# Patient Record
Sex: Female | Born: 1960
Health system: Southern US, Community
[De-identification: ages and names within clinical notes are randomized; demographics above are authoritative.]

## PROBLEM LIST (undated history)

## (undated) DIAGNOSIS — D219 Benign neoplasm of connective and other soft tissue, unspecified: Secondary | ICD-10-CM

## (undated) DIAGNOSIS — E669 Obesity, unspecified: Secondary | ICD-10-CM

## (undated) DIAGNOSIS — E039 Hypothyroidism, unspecified: Secondary | ICD-10-CM

## (undated) DIAGNOSIS — M1990 Unspecified osteoarthritis, unspecified site: Secondary | ICD-10-CM

## (undated) DIAGNOSIS — N939 Abnormal uterine and vaginal bleeding, unspecified: Secondary | ICD-10-CM

## (undated) DIAGNOSIS — I10 Essential (primary) hypertension: Secondary | ICD-10-CM

## (undated) HISTORY — DX: Hypothyroidism, unspecified: E03.9

## (undated) HISTORY — DX: Essential (primary) hypertension: I10

## (undated) HISTORY — PX: NO PAST SURGERIES: SHX2092

## (undated) HISTORY — DX: Obesity, unspecified: E66.9

---

## 2001-05-21 ENCOUNTER — Encounter: Payer: Self-pay | Admitting: Family Medicine

## 2001-05-21 ENCOUNTER — Ambulatory Visit (HOSPITAL_COMMUNITY): Admission: RE | Admit: 2001-05-21 | Discharge: 2001-05-21 | Payer: Self-pay | Admitting: Family Medicine

## 2003-02-05 ENCOUNTER — Encounter (INDEPENDENT_AMBULATORY_CARE_PROVIDER_SITE_OTHER): Payer: Self-pay | Admitting: Family Medicine

## 2003-02-05 ENCOUNTER — Encounter: Admission: RE | Admit: 2003-02-05 | Discharge: 2003-02-05 | Payer: Self-pay | Admitting: Family Medicine

## 2004-04-07 ENCOUNTER — Encounter (INDEPENDENT_AMBULATORY_CARE_PROVIDER_SITE_OTHER): Payer: Self-pay | Admitting: Family Medicine

## 2004-04-07 ENCOUNTER — Encounter: Admission: RE | Admit: 2004-04-07 | Discharge: 2004-04-07 | Payer: Self-pay | Admitting: Family Medicine

## 2004-12-08 ENCOUNTER — Ambulatory Visit: Payer: Self-pay | Admitting: Family Medicine

## 2004-12-22 ENCOUNTER — Encounter: Admission: RE | Admit: 2004-12-22 | Discharge: 2005-03-22 | Payer: Self-pay | Admitting: Family Medicine

## 2005-01-05 ENCOUNTER — Ambulatory Visit: Payer: Self-pay | Admitting: Family Medicine

## 2005-01-11 ENCOUNTER — Emergency Department (HOSPITAL_COMMUNITY): Admission: EM | Admit: 2005-01-11 | Discharge: 2005-01-11 | Payer: Self-pay | Admitting: Emergency Medicine

## 2005-04-20 ENCOUNTER — Ambulatory Visit: Payer: Self-pay | Admitting: Family Medicine

## 2005-05-11 ENCOUNTER — Ambulatory Visit: Payer: Self-pay | Admitting: Family Medicine

## 2005-10-06 ENCOUNTER — Ambulatory Visit: Payer: Self-pay | Admitting: Family Medicine

## 2005-10-10 ENCOUNTER — Ambulatory Visit: Payer: Self-pay | Admitting: Family Medicine

## 2005-12-07 ENCOUNTER — Ambulatory Visit: Payer: Self-pay | Admitting: Family Medicine

## 2006-01-13 ENCOUNTER — Encounter (INDEPENDENT_AMBULATORY_CARE_PROVIDER_SITE_OTHER): Payer: Self-pay | Admitting: *Deleted

## 2006-01-25 ENCOUNTER — Encounter (INDEPENDENT_AMBULATORY_CARE_PROVIDER_SITE_OTHER): Payer: Self-pay | Admitting: Family Medicine

## 2006-01-25 ENCOUNTER — Ambulatory Visit: Payer: Self-pay | Admitting: Family Medicine

## 2006-03-23 ENCOUNTER — Ambulatory Visit: Payer: Self-pay | Admitting: Family Medicine

## 2006-03-29 ENCOUNTER — Ambulatory Visit: Payer: Self-pay | Admitting: Family Medicine

## 2006-09-19 ENCOUNTER — Ambulatory Visit: Payer: Self-pay | Admitting: Family Medicine

## 2006-10-06 ENCOUNTER — Ambulatory Visit: Payer: Self-pay | Admitting: Family Medicine

## 2006-10-12 DIAGNOSIS — I10 Essential (primary) hypertension: Secondary | ICD-10-CM | POA: Insufficient documentation

## 2006-10-12 DIAGNOSIS — E669 Obesity, unspecified: Secondary | ICD-10-CM | POA: Insufficient documentation

## 2006-10-12 DIAGNOSIS — E785 Hyperlipidemia, unspecified: Secondary | ICD-10-CM | POA: Insufficient documentation

## 2006-10-12 DIAGNOSIS — M545 Low back pain, unspecified: Secondary | ICD-10-CM | POA: Insufficient documentation

## 2006-10-13 ENCOUNTER — Encounter (INDEPENDENT_AMBULATORY_CARE_PROVIDER_SITE_OTHER): Payer: Self-pay | Admitting: *Deleted

## 2006-10-20 ENCOUNTER — Encounter (INDEPENDENT_AMBULATORY_CARE_PROVIDER_SITE_OTHER): Payer: Self-pay | Admitting: Family Medicine

## 2007-01-02 ENCOUNTER — Ambulatory Visit: Payer: Self-pay | Admitting: Family Medicine

## 2007-01-16 ENCOUNTER — Telehealth: Payer: Self-pay | Admitting: *Deleted

## 2007-01-17 ENCOUNTER — Ambulatory Visit: Payer: Self-pay | Admitting: Family Medicine

## 2007-01-19 ENCOUNTER — Telehealth: Payer: Self-pay | Admitting: *Deleted

## 2007-01-22 ENCOUNTER — Ambulatory Visit: Payer: Self-pay | Admitting: Sports Medicine

## 2007-02-08 ENCOUNTER — Encounter (INDEPENDENT_AMBULATORY_CARE_PROVIDER_SITE_OTHER): Payer: Self-pay | Admitting: Family Medicine

## 2007-02-08 ENCOUNTER — Ambulatory Visit: Payer: Self-pay | Admitting: Family Medicine

## 2007-02-08 LAB — CONVERTED CEMR LAB
ALT: 17 units/L (ref 0–35)
AST: 20 units/L (ref 0–37)
Alkaline Phosphatase: 72 units/L (ref 39–117)
Cholesterol: 200 mg/dL (ref 0–200)
Creatinine, Ser: 0.91 mg/dL (ref 0.40–1.20)
HCT: 38.8 %
Hemoglobin: 13.6 g/dL
MCV: 83.2 fL
Platelets: 405 10*3/uL
TSH: 1.204 microintl units/mL (ref 0.350–5.50)
Total Bilirubin: 0.7 mg/dL (ref 0.3–1.2)
Total CHOL/HDL Ratio: 5
VLDL: 34 mg/dL (ref 0–40)

## 2007-03-06 ENCOUNTER — Telehealth (INDEPENDENT_AMBULATORY_CARE_PROVIDER_SITE_OTHER): Payer: Self-pay | Admitting: *Deleted

## 2007-03-07 ENCOUNTER — Ambulatory Visit: Payer: Self-pay | Admitting: Family Medicine

## 2007-03-07 LAB — CONVERTED CEMR LAB
Ketones, urine, test strip: NEGATIVE
Nitrite: NEGATIVE
Urobilinogen, UA: 0.2
pH: 6

## 2007-03-08 ENCOUNTER — Encounter (INDEPENDENT_AMBULATORY_CARE_PROVIDER_SITE_OTHER): Payer: Self-pay | Admitting: Family Medicine

## 2007-04-23 ENCOUNTER — Ambulatory Visit: Payer: Self-pay | Admitting: Family Medicine

## 2007-04-23 ENCOUNTER — Telehealth (INDEPENDENT_AMBULATORY_CARE_PROVIDER_SITE_OTHER): Payer: Self-pay | Admitting: *Deleted

## 2007-05-25 ENCOUNTER — Ambulatory Visit: Payer: Self-pay | Admitting: Family Medicine

## 2007-05-25 ENCOUNTER — Encounter (INDEPENDENT_AMBULATORY_CARE_PROVIDER_SITE_OTHER): Payer: Self-pay | Admitting: Family Medicine

## 2007-05-31 ENCOUNTER — Encounter: Admission: RE | Admit: 2007-05-31 | Discharge: 2007-08-29 | Payer: Self-pay | Admitting: Family Medicine

## 2007-06-01 ENCOUNTER — Encounter (INDEPENDENT_AMBULATORY_CARE_PROVIDER_SITE_OTHER): Payer: Self-pay | Admitting: *Deleted

## 2007-06-05 ENCOUNTER — Ambulatory Visit: Payer: Self-pay | Admitting: Family Medicine

## 2007-06-05 ENCOUNTER — Telehealth: Payer: Self-pay | Admitting: *Deleted

## 2007-06-14 ENCOUNTER — Telehealth (INDEPENDENT_AMBULATORY_CARE_PROVIDER_SITE_OTHER): Payer: Self-pay | Admitting: *Deleted

## 2007-06-19 ENCOUNTER — Telehealth (INDEPENDENT_AMBULATORY_CARE_PROVIDER_SITE_OTHER): Payer: Self-pay | Admitting: *Deleted

## 2007-06-20 ENCOUNTER — Encounter (INDEPENDENT_AMBULATORY_CARE_PROVIDER_SITE_OTHER): Payer: Self-pay | Admitting: Family Medicine

## 2007-06-25 ENCOUNTER — Encounter: Payer: Self-pay | Admitting: *Deleted

## 2007-07-26 ENCOUNTER — Encounter (INDEPENDENT_AMBULATORY_CARE_PROVIDER_SITE_OTHER): Payer: Self-pay | Admitting: Family Medicine

## 2007-07-27 ENCOUNTER — Ambulatory Visit: Payer: Self-pay | Admitting: Family Medicine

## 2007-07-27 ENCOUNTER — Telehealth: Payer: Self-pay | Admitting: *Deleted

## 2007-07-27 ENCOUNTER — Encounter (INDEPENDENT_AMBULATORY_CARE_PROVIDER_SITE_OTHER): Payer: Self-pay | Admitting: Family Medicine

## 2007-07-30 LAB — CONVERTED CEMR LAB
HCT: 38.5 % (ref 36.0–46.0)
Hemoglobin: 12.5 g/dL (ref 12.0–15.0)
RDW: 15.8 % — ABNORMAL HIGH (ref 11.5–15.5)

## 2007-07-31 ENCOUNTER — Encounter (INDEPENDENT_AMBULATORY_CARE_PROVIDER_SITE_OTHER): Payer: Self-pay | Admitting: Family Medicine

## 2007-07-31 ENCOUNTER — Ambulatory Visit (HOSPITAL_COMMUNITY): Admission: RE | Admit: 2007-07-31 | Discharge: 2007-07-31 | Payer: Self-pay | Admitting: Family Medicine

## 2007-08-03 ENCOUNTER — Ambulatory Visit: Payer: Self-pay | Admitting: Family Medicine

## 2007-08-03 DIAGNOSIS — E039 Hypothyroidism, unspecified: Secondary | ICD-10-CM | POA: Insufficient documentation

## 2007-09-03 ENCOUNTER — Ambulatory Visit: Payer: Self-pay | Admitting: Sports Medicine

## 2007-09-03 ENCOUNTER — Encounter (INDEPENDENT_AMBULATORY_CARE_PROVIDER_SITE_OTHER): Payer: Self-pay | Admitting: Family Medicine

## 2007-09-03 LAB — CONVERTED CEMR LAB: Hgb A1c MFr Bld: 5.8 %

## 2007-09-04 ENCOUNTER — Encounter (INDEPENDENT_AMBULATORY_CARE_PROVIDER_SITE_OTHER): Payer: Self-pay | Admitting: Family Medicine

## 2007-09-04 LAB — CONVERTED CEMR LAB: TSH: 38.269 microintl units/mL — ABNORMAL HIGH (ref 0.350–5.50)

## 2007-11-01 ENCOUNTER — Ambulatory Visit: Payer: Self-pay | Admitting: Family Medicine

## 2007-11-01 ENCOUNTER — Encounter (INDEPENDENT_AMBULATORY_CARE_PROVIDER_SITE_OTHER): Payer: Self-pay | Admitting: Family Medicine

## 2007-11-02 ENCOUNTER — Encounter (INDEPENDENT_AMBULATORY_CARE_PROVIDER_SITE_OTHER): Payer: Self-pay | Admitting: Family Medicine

## 2007-11-07 ENCOUNTER — Ambulatory Visit: Payer: Self-pay | Admitting: Family Medicine

## 2007-11-07 ENCOUNTER — Encounter (INDEPENDENT_AMBULATORY_CARE_PROVIDER_SITE_OTHER): Payer: Self-pay | Admitting: Family Medicine

## 2007-11-07 LAB — CONVERTED CEMR LAB
Albumin: 4.6 g/dL (ref 3.5–5.2)
CO2: 27 meq/L (ref 19–32)
Chloride: 99 meq/L (ref 96–112)
Glucose, Bld: 95 mg/dL (ref 70–99)
H Pylori IgG: POSITIVE
HCT: 36.2 % (ref 36.0–46.0)
Lipase: 13 units/L (ref 0–75)
Lymphocytes Relative: 17 % (ref 12–46)
Lymphs Abs: 1.5 10*3/uL (ref 0.7–4.0)
Neutro Abs: 6.1 10*3/uL (ref 1.7–7.7)
Neutrophils Relative %: 70 % (ref 43–77)
Platelets: 519 10*3/uL — ABNORMAL HIGH (ref 150–400)
Potassium: 4.1 meq/L (ref 3.5–5.3)
Sodium: 140 meq/L (ref 135–145)
Total Protein: 7.9 g/dL (ref 6.0–8.3)
WBC: 8.7 10*3/uL (ref 4.0–10.5)

## 2007-11-08 ENCOUNTER — Telehealth (INDEPENDENT_AMBULATORY_CARE_PROVIDER_SITE_OTHER): Payer: Self-pay | Admitting: Family Medicine

## 2007-11-12 ENCOUNTER — Telehealth: Payer: Self-pay | Admitting: *Deleted

## 2007-11-13 ENCOUNTER — Encounter: Admission: RE | Admit: 2007-11-13 | Discharge: 2007-11-13 | Payer: Self-pay | Admitting: Family Medicine

## 2007-11-30 ENCOUNTER — Telehealth (INDEPENDENT_AMBULATORY_CARE_PROVIDER_SITE_OTHER): Payer: Self-pay | Admitting: Family Medicine

## 2007-12-17 ENCOUNTER — Ambulatory Visit: Payer: Self-pay | Admitting: Sports Medicine

## 2007-12-17 ENCOUNTER — Encounter: Payer: Self-pay | Admitting: *Deleted

## 2007-12-20 ENCOUNTER — Encounter (INDEPENDENT_AMBULATORY_CARE_PROVIDER_SITE_OTHER): Payer: Self-pay | Admitting: Family Medicine

## 2007-12-20 ENCOUNTER — Ambulatory Visit: Payer: Self-pay | Admitting: Family Medicine

## 2007-12-25 ENCOUNTER — Encounter (INDEPENDENT_AMBULATORY_CARE_PROVIDER_SITE_OTHER): Payer: Self-pay | Admitting: Family Medicine

## 2008-01-03 ENCOUNTER — Telehealth: Payer: Self-pay | Admitting: *Deleted

## 2008-01-04 ENCOUNTER — Ambulatory Visit: Payer: Self-pay | Admitting: Family Medicine

## 2008-01-04 ENCOUNTER — Encounter: Admission: RE | Admit: 2008-01-04 | Discharge: 2008-01-04 | Payer: Self-pay | Admitting: Family Medicine

## 2008-01-04 ENCOUNTER — Encounter (INDEPENDENT_AMBULATORY_CARE_PROVIDER_SITE_OTHER): Payer: Self-pay | Admitting: Family Medicine

## 2008-01-04 LAB — CONVERTED CEMR LAB
Basophils Absolute: 0.1 10*3/uL (ref 0.0–0.1)
Eosinophils Absolute: 0.5 10*3/uL (ref 0.0–0.7)
Lymphocytes Relative: 23 % (ref 12–46)
Lymphs Abs: 2 10*3/uL (ref 0.7–4.0)
MCV: 81.3 fL (ref 78.0–100.0)
Neutrophils Relative %: 64 % (ref 43–77)
Platelets: 430 10*3/uL — ABNORMAL HIGH (ref 150–400)
RDW: 16.2 % — ABNORMAL HIGH (ref 11.5–15.5)
WBC: 8.4 10*3/uL (ref 4.0–10.5)

## 2008-01-08 ENCOUNTER — Telehealth (INDEPENDENT_AMBULATORY_CARE_PROVIDER_SITE_OTHER): Payer: Self-pay | Admitting: Family Medicine

## 2008-01-10 ENCOUNTER — Encounter (INDEPENDENT_AMBULATORY_CARE_PROVIDER_SITE_OTHER): Payer: Self-pay | Admitting: Family Medicine

## 2008-10-06 ENCOUNTER — Ambulatory Visit: Payer: Self-pay | Admitting: Family Medicine

## 2008-10-06 ENCOUNTER — Encounter (INDEPENDENT_AMBULATORY_CARE_PROVIDER_SITE_OTHER): Payer: Self-pay | Admitting: Family Medicine

## 2008-10-07 LAB — CONVERTED CEMR LAB
Free T4: 0.22 ng/dL — ABNORMAL LOW (ref 0.89–1.80)
TSH: 39.022 microintl units/mL — ABNORMAL HIGH (ref 0.350–4.50)

## 2008-10-28 ENCOUNTER — Encounter (INDEPENDENT_AMBULATORY_CARE_PROVIDER_SITE_OTHER): Payer: Self-pay | Admitting: Family Medicine

## 2008-12-04 ENCOUNTER — Encounter (INDEPENDENT_AMBULATORY_CARE_PROVIDER_SITE_OTHER): Payer: Self-pay | Admitting: Family Medicine

## 2008-12-04 ENCOUNTER — Ambulatory Visit: Payer: Self-pay | Admitting: Family Medicine

## 2009-02-04 IMAGING — US US ABDOMEN COMPLETE
1 series · 14 of 25 positions shown · non-contrast
Comparison: None

CLINICAL DATA: Epigastric pain

ABDOMEN ULTRASOUND
TECHNIQUE: Complete abdominal ultrasound examination was performed
including evaluation of the liver, gallbladder, bile ducts,
pancreas, kidneys, spleen, IVC, and abdominal aorta.

[Series 1: us abdomen complete · 0.37mm/px · 14 of 56 slices shown]
[im 1/56]
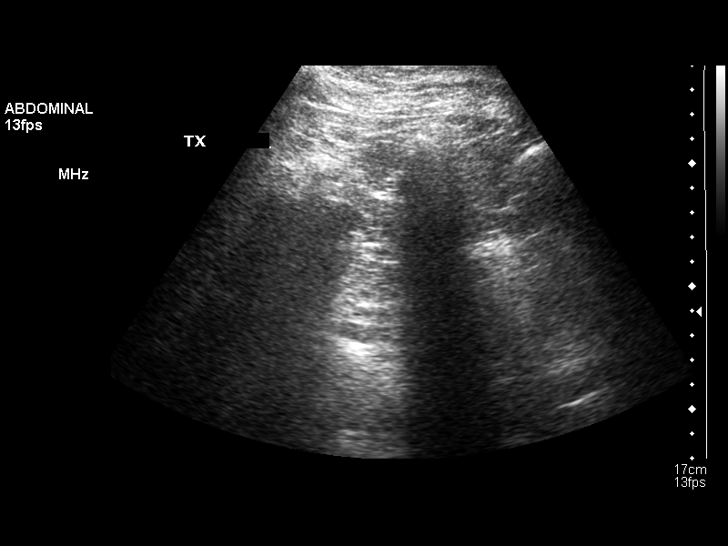
[im 5/56]
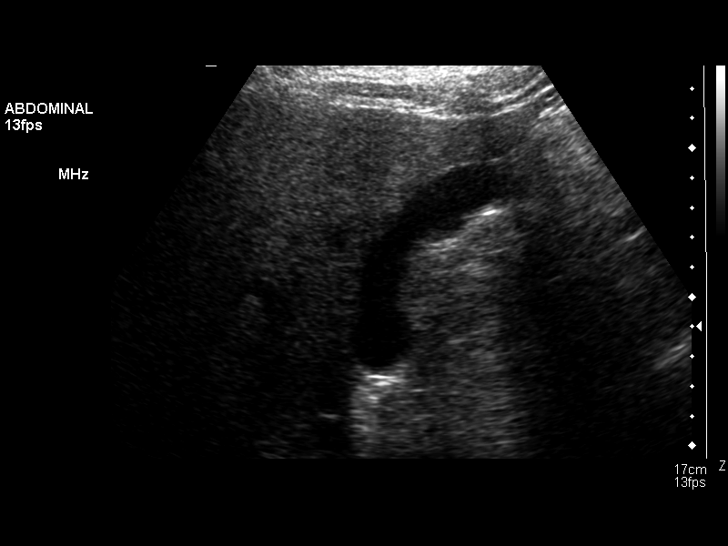
[im 10/56]
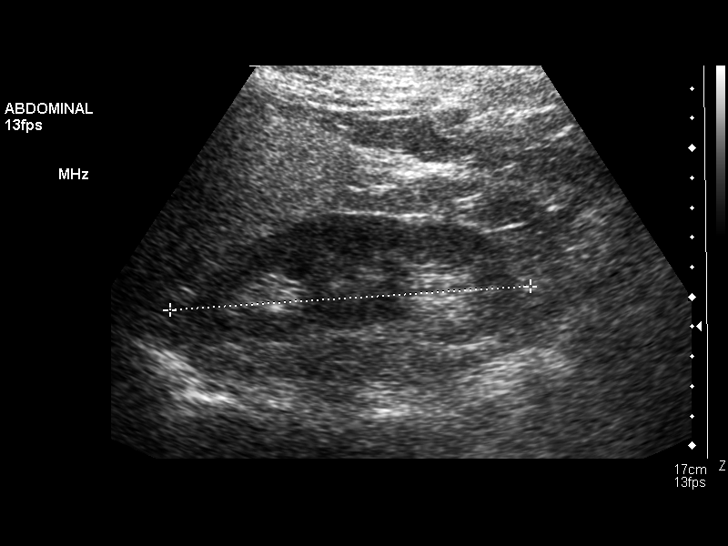
[im 14/56]
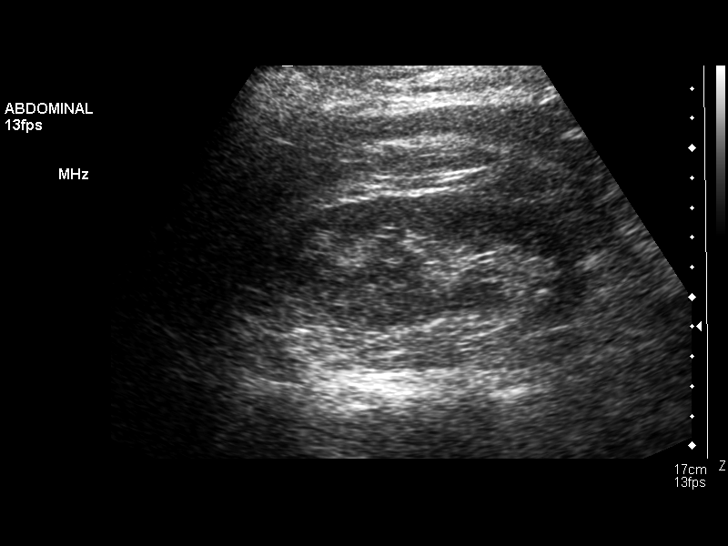
[im 19/56]
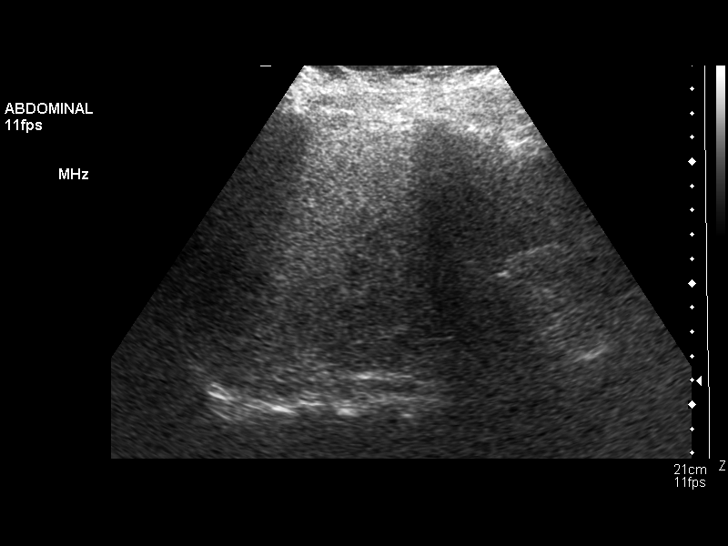
[im 21/56]
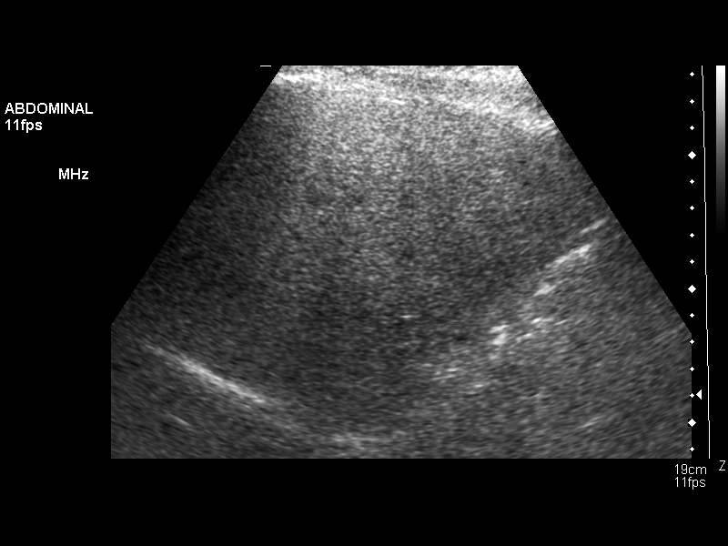
[im 26/56]
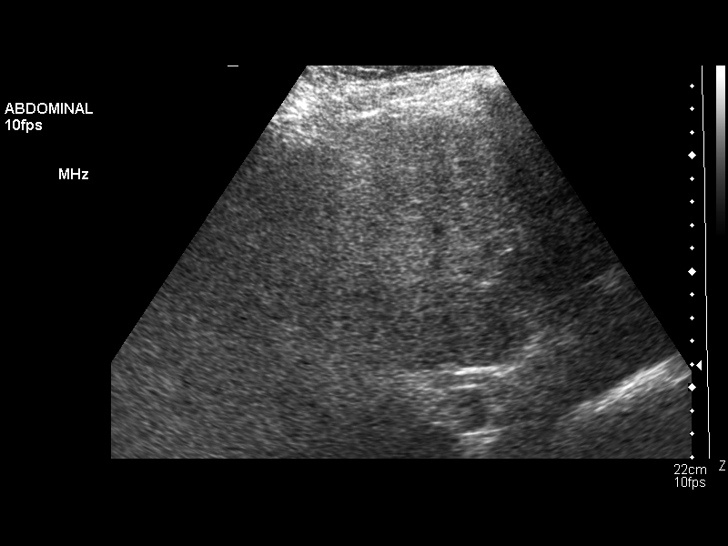
[im 30/56]
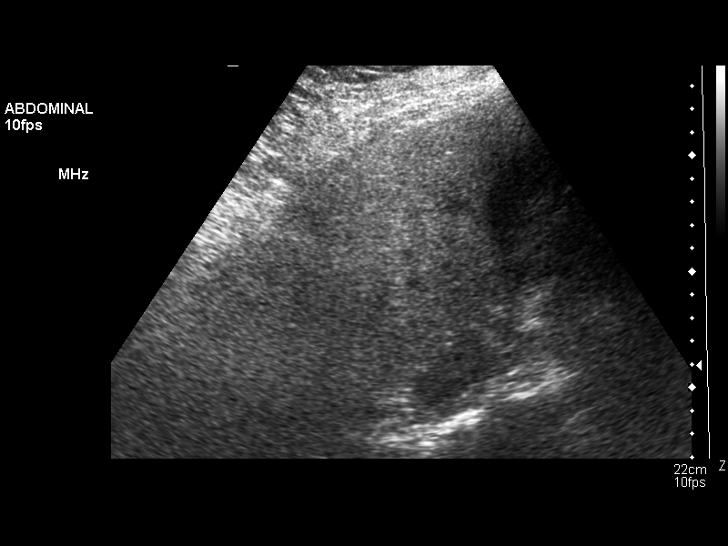
[im 35/56]
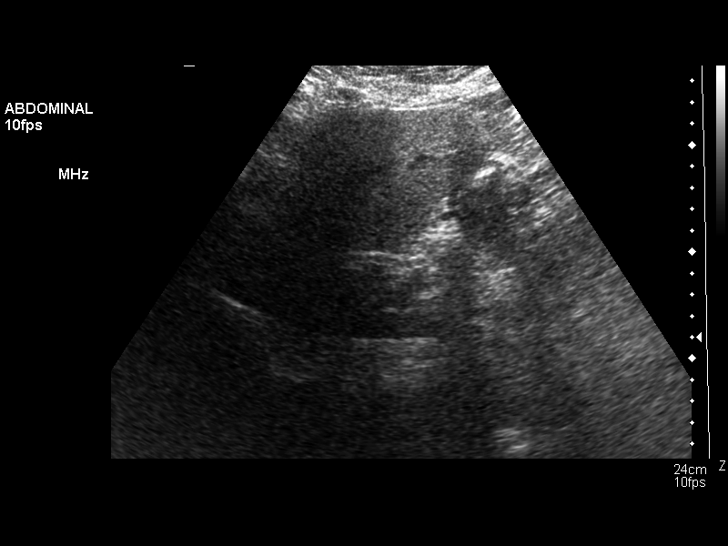
[im 37/56]
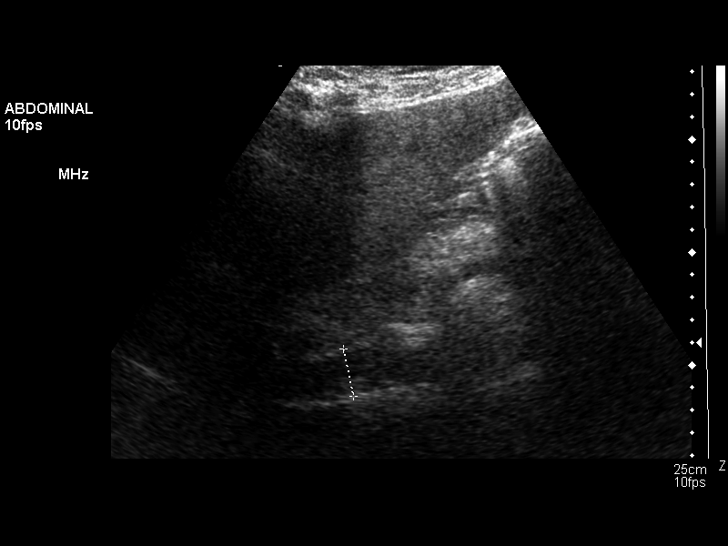
[im 42/56]
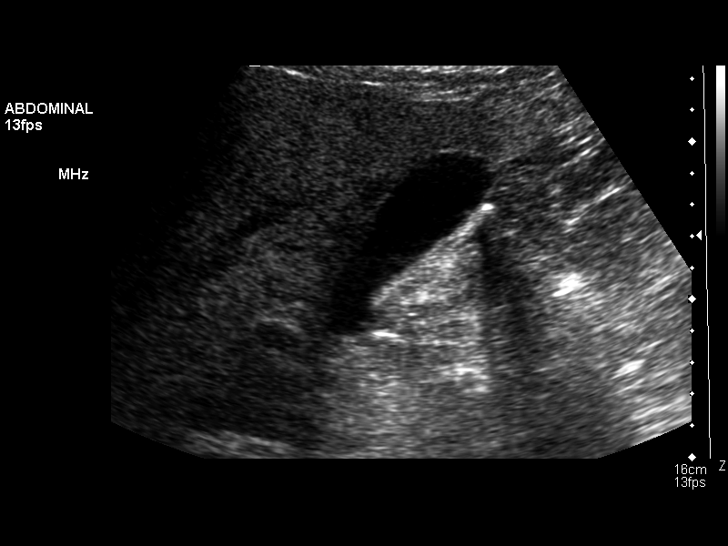
[im 46/56]
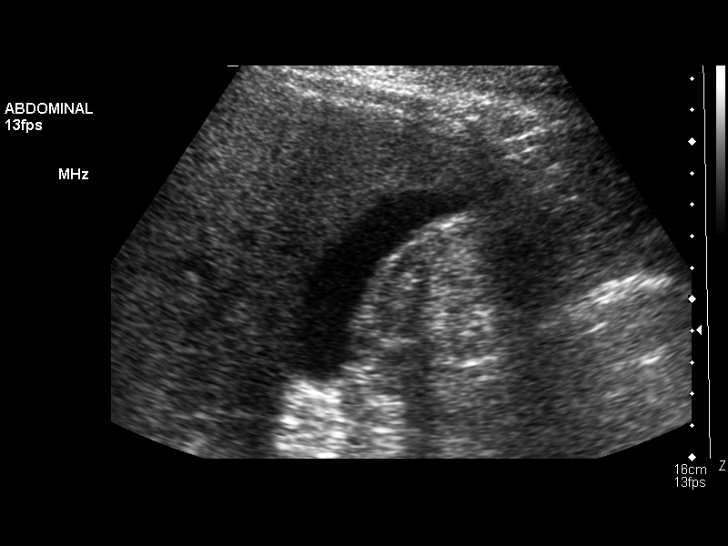
[im 51/56]
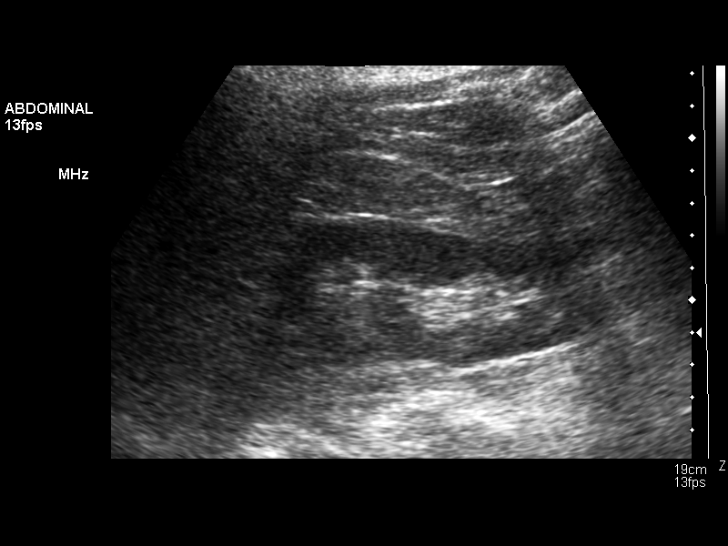
[im 56/56]
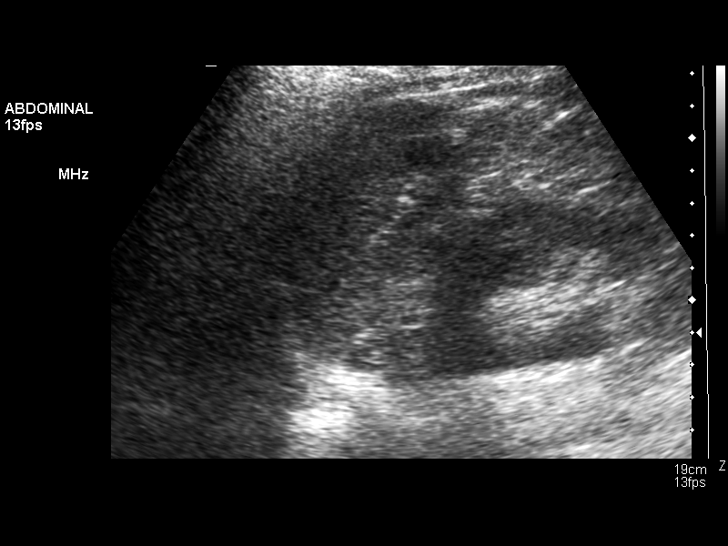

[14 of 25 positions shown; findings below may reference images not displayed]

FINDINGS: Findings consistent with nonshadowing slight gall sludge
noted without gallstones.  Gallbladder is otherwise sonographically
normal with normal wall thickness of 2 mm.  No dilated intrahepatic
nor extrahepatic bile ducts are seen with common bile duct
measuring normally at 5 mm.  Diffuse increased liver echogenicity
is consistent with slight fatty change with no focal liver lesions
noted.  Inferior vena cava, pancreas, mid to distal abdominal aorta
are obscured by overlying gas intestinal gas.  Spleen (8.4 cm
long), right kidney (12.1 cm long), and left kidney (10 cm long)
appear sonographically normal
IMPRESSION: 1.  Slight gall sludge
2.  Slight diffuse fatty infiltration liver.
3.  Nonvisualization of pancreas, inferior vena cava, and mid to
distal abdominal aorta.
4.  Otherwise negative

## 2009-02-12 ENCOUNTER — Telehealth: Payer: Self-pay | Admitting: Family Medicine

## 2009-04-13 ENCOUNTER — Telehealth: Payer: Self-pay | Admitting: Family Medicine

## 2009-06-16 ENCOUNTER — Telehealth: Payer: Self-pay | Admitting: Family Medicine

## 2009-06-17 ENCOUNTER — Ambulatory Visit: Payer: Self-pay | Admitting: Family Medicine

## 2009-06-17 ENCOUNTER — Encounter: Payer: Self-pay | Admitting: Family Medicine

## 2009-06-17 LAB — CONVERTED CEMR LAB
Calcium: 9.3 mg/dL (ref 8.4–10.5)
Creatinine, Ser: 0.8 mg/dL (ref 0.40–1.20)
Glucose, Bld: 84 mg/dL (ref 70–99)
Sodium: 143 meq/L (ref 135–145)
TSH: 4.421 microintl units/mL (ref 0.350–4.500)

## 2009-09-08 ENCOUNTER — Ambulatory Visit: Payer: Self-pay | Admitting: Family Medicine

## 2009-09-08 ENCOUNTER — Telehealth: Payer: Self-pay | Admitting: *Deleted

## 2009-09-08 ENCOUNTER — Encounter: Payer: Self-pay | Admitting: Family Medicine

## 2009-09-08 LAB — CONVERTED CEMR LAB
BUN: 16 mg/dL (ref 6–23)
Chloride: 103 meq/L (ref 96–112)
Cholesterol: 186 mg/dL (ref 0–200)
HDL: 39 mg/dL — ABNORMAL LOW (ref >39)
Hemoglobin: 14 g/dL (ref 12.0–15.0)
LDL Cholesterol: 125 mg/dL — ABNORMAL HIGH (ref 0–99)
MCHC: 32.6 g/dL (ref 30.0–36.0)
Platelets: 358 10*3/uL (ref 150–400)
Potassium: 3.9 meq/L (ref 3.5–5.3)
RDW: 13.7 % (ref 11.5–15.5)
Sodium: 138 meq/L (ref 135–145)
Total CHOL/HDL Ratio: 4.8
Triglycerides: 111 mg/dL (ref <150)
VLDL: 22 mg/dL (ref 0–40)

## 2009-09-09 ENCOUNTER — Encounter: Payer: Self-pay | Admitting: Family Medicine

## 2009-09-15 ENCOUNTER — Encounter: Admission: RE | Admit: 2009-09-15 | Discharge: 2009-09-15 | Payer: Self-pay | Admitting: Family Medicine

## 2010-01-21 ENCOUNTER — Ambulatory Visit: Payer: Self-pay | Admitting: Family Medicine

## 2010-01-21 ENCOUNTER — Encounter: Payer: Self-pay | Admitting: Family Medicine

## 2010-01-22 ENCOUNTER — Telehealth: Payer: Self-pay | Admitting: Family Medicine

## 2010-01-22 ENCOUNTER — Telehealth: Payer: Self-pay | Admitting: *Deleted

## 2010-04-01 ENCOUNTER — Telehealth: Payer: Self-pay | Admitting: Family Medicine

## 2010-04-25 ENCOUNTER — Emergency Department (HOSPITAL_COMMUNITY): Admission: EM | Admit: 2010-04-25 | Discharge: 2010-04-25 | Payer: Self-pay | Admitting: Emergency Medicine

## 2010-04-29 ENCOUNTER — Encounter: Admission: RE | Admit: 2010-04-29 | Discharge: 2010-04-29 | Payer: Self-pay | Admitting: Family Medicine

## 2010-04-29 ENCOUNTER — Encounter: Payer: Self-pay | Admitting: Family Medicine

## 2010-04-29 ENCOUNTER — Ambulatory Visit: Payer: Self-pay | Admitting: Family Medicine

## 2010-04-30 LAB — CONVERTED CEMR LAB: TSH: 0.143 microintl units/mL — ABNORMAL LOW (ref 0.350–4.500)

## 2010-05-28 ENCOUNTER — Encounter: Payer: Self-pay | Admitting: Family Medicine

## 2010-05-28 LAB — CONVERTED CEMR LAB
ALT: 18 units/L
AST: 20 units/L
Albumin: 4.1 g/dL
Alkaline Phosphatase: 86 units/L
Basophils Absolute: 0.1 10*3/uL
CO2: 25 meq/L
Calcium: 9.2 mg/dL
Chloride: 102 meq/L
Creatinine, Ser: 0.66 mg/dL
Eosinophils Absolute: 0.5 10*3/uL
HCT: 39.1 %
MCV: 83 fL
Monocytes Absolute: 0.5 10*3/uL
Neutro Abs: 4.9 10*3/uL
Platelets: 351 10*3/uL
Potassium: 3.8 meq/L
RBC: 4.72 M/uL
Sodium: 139 meq/L
Triglycerides: 154 mg/dL
VLDL: 31 mg/dL
WBC: 7.6 10*3/uL

## 2010-06-07 ENCOUNTER — Ambulatory Visit: Payer: Self-pay | Admitting: Family Medicine

## 2010-06-07 ENCOUNTER — Encounter: Payer: Self-pay | Admitting: Family Medicine

## 2010-06-08 LAB — CONVERTED CEMR LAB: TSH: 0.059 u[IU]/mL — ABNORMAL LOW

## 2010-06-16 ENCOUNTER — Ambulatory Visit: Payer: Self-pay | Admitting: Family Medicine

## 2010-06-16 DIAGNOSIS — N926 Irregular menstruation, unspecified: Secondary | ICD-10-CM | POA: Insufficient documentation

## 2010-06-21 ENCOUNTER — Encounter: Payer: Self-pay | Admitting: Family Medicine

## 2010-06-23 ENCOUNTER — Telehealth: Payer: Self-pay | Admitting: Family Medicine

## 2010-07-06 ENCOUNTER — Ambulatory Visit: Payer: Self-pay | Admitting: Family Medicine

## 2010-07-06 ENCOUNTER — Encounter: Payer: Self-pay | Admitting: Family Medicine

## 2010-09-16 NOTE — Assessment & Plan Note (Signed)
Summary: back pain    Vital Signs:  Patient profile:   50 year old female Height:      61 inches Weight:      212 pounds BMI:     40.20 Temp:     99.5 degrees F oral Pulse rate:   101 / minute BP sitting:   130 / 93  (right arm) Cuff size:   regular  Vitals Entered By: Tessie Fass CMA (April 29, 2010 4:08 PM) CC: back pain x 2-3 days Is Patient Diabetic? No Pain Assessment Patient in pain? yes     Location: lower back Intensity: 8   Primary Care Provider:  Cat Ta MD  CC:  back pain x 2-3 days.  History of Present Illness: 50y/o F with   BACK PAIN Location: lower left side  Onset: 2-3 days  Description: sharp pain that starts in Left buttocks, skips thigh, but goes to calf Modifying factors:   Symptoms Worse with: siting, standing Better with: lying down Trauma: no  Red Flags Fecal/urinary incontinence: no Weakness: no Fever/chills: no Night pain: yes Unexplained weight loss: no No relief with bedrest: laying down makes it better, hurts to sleep on that side Cancer/immunosuppression: no  IV drug use: no  PMH of osteoporosis or chronic steroid use: no  Taking Ibuprofen 600mg  with only 1 hour of pain relief    Habits & Providers  Alcohol-Tobacco-Diet     Tobacco Status: never  Current Medications (verified): 1)  Maxzide-25 37.5-25 Mg Tabs (Triamterene-Hctz) .... Take 1 Tablet By Mouth Once A Day 2)  Synthroid 175 Mcg Tabs (Levothyroxine Sodium) .Marland Kitchen.. 1 By Mouth Daily 3)  Ultram 50 Mg Tabs (Tramadol Hcl) .Marland Kitchen.. 1 Tab By Mouth Three Times A Day As Needed Severe Pain 4)  Flexeril 10 Mg Tabs (Cyclobenzaprine Hcl) .Marland Kitchen.. 1 Tab By Mouth Three Times A Day As Needed Muscle Spasm. May Cause Drowsiness.  Allergies (verified): No Known Drug Allergies  Past History:  Past Medical History: Last updated: 01/21/2010 Back- bulging disc (sees Dr Smitty Cords, [facility]) HTN hypothryoid Menorrhagia Obesity ***Medical Noncompliance  Past Surgical History: Last  updated: 01/04/2008 none    Family History: Last updated: 09/08/2009 HTN in mother, + stomach cancer in aunt, No asthma or eczema, No CAD, No DM, No female cancers (breast, cervix, uterine), aunt with leukemia   No family history of Breast or GYN cancer Uncle with throat cancer (smoker)  Social History: Last updated: 09/08/2009 Lives alone.  Grown daughter in [place].  Has mother that lives in [place]. No alcohol, tobacco or drug use;  Technician in medical supplies production;  exercises regularly; fair to poor diet.      Risk Factors: Alcohol Use: 0 (09/08/2009) Exercise: yes (01/21/2010)  Risk Factors: Smoking Status: never (04/29/2010)  Review of Systems       per hpi   Physical Exam  General:  Well-developed,well-nourished,in no acute distress; alert,appropriate and cooperative throughout examination. vitals reviewed.   Msk:  Back Exam: Inspection: normal  Motion:forward flexion wnl.  back extension caused some pain.  SLR seated: wnl                        SLR lying: wnl XSLR seated:  pain on L                       XSLR lying: pain on L  Seated HS Flexibility: Palpable tenderness: Left SI joint tenderness  FABER: Sensory change:no Reflex  change:no  Strength at foot Plantar-flexion: 5 / 5    Dorsi-flexion: 5 / 5    Eversion:  5/ 5   Inversion:  5/ 5 Leg strength Quad:  5/ 5   Hamstring:  5/ 5   Hip flexor:  4/ 5   Hip abductors:  4/ 5 Gait Walking: antalgic       Heels:unsteady           Toes: unsteady           Tandem:unsteady   Pulses:  R and L carotid,radial,femoral,dorsalis pedis and posterior tibial pulses are full and equal bilaterally Neurologic:  alert & oriented X3 and cranial nerves II-XII intact.     Impression & Recommendations:  Problem # 1:  BACK PAIN, LOW (ICD-724.2) Assessment New Left SI joint pain to palpation.  Will try ultram and flexeril (worked for pt in past).  Will get xrays.  If pain not better in 2 wks, pt may warrant referral to  PT or Orthopedics for injection.  Her updated medication list for this problem includes:    Ultram 50 Mg Tabs (Tramadol hcl) .Marland Kitchen... 1 tab by mouth three times a day as needed severe pain    Flexeril 10 Mg Tabs (Cyclobenzaprine hcl) .Marland Kitchen... 1 tab by mouth three times a day as needed muscle spasm. may cause drowsiness.  Orders: Radiology other (Radiology Other) [facility]- Est Level  3 ([address])  Complete Medication List: 1)  Maxzide-25 37.5-25 Mg Tabs (Triamterene-hctz) .... Take 1 tablet by mouth once a day 2)  Synthroid 175 Mcg Tabs (Levothyroxine sodium) .Marland Kitchen.. 1 by mouth daily 3)  Ultram 50 Mg Tabs (Tramadol hcl) .Marland Kitchen.. 1 tab by mouth three times a day as needed severe pain 4)  Flexeril 10 Mg Tabs (Cyclobenzaprine hcl) .Marland Kitchen.. 1 tab by mouth three times a day as needed muscle spasm. may cause drowsiness.  Other Orders: TSH-[facility] ([id number]) Prescriptions: FLEXERIL 10 MG TABS (CYCLOBENZAPRINE HCL) 1 tab by mouth three times a day as needed muscle spasm. May cause drowsiness.  #60 x 1   Entered and Authorized by:   Angeline Slim MD   Signed by:   Angeline Slim MD on 04/29/2010   Method used:   Electronically to        [facility] [phone]* (retail)       [address]       [place], [place]  [address]       Ph: [phone]       Fax: [phone]   RxID:   [id number] ULTRAM 50 MG TABS (TRAMADOL HCL) 1 tab by mouth three times a day as needed severe pain  #60 x 1   Entered and Authorized by:   Angeline Slim MD   Signed by:   Angeline Slim MD on 04/29/2010   Method used:   Electronically to        [facility] [phone]* (retail)       [address]       [place], [place]  [address]       Ph: [phone]       Fax: [phone]   RxID:   [id number]

## 2010-09-16 NOTE — Progress Notes (Signed)
Summary: phn msg  Phone Note Call from Patient Call back at Home Phone [phone]   Caller: Patient Summary of Call: company gives compression socks for her to stand for long- wants to know if Ta has rec'd fax to approve for the socks.  Company is [employer] Initial call taken by: De Nurse,  June 23, 2010 9:54 AM  Follow-up for Phone Call        I believe I filled this out last week.  Follow-up by: Angeline Slim MD,  June 24, 2010 10:47 AM

## 2010-09-16 NOTE — Assessment & Plan Note (Signed)
Summary: f/up,tcb   Vital Signs:  Patient profile:   50 year old female Height:      61 inches Weight:      225 pounds BMI:     42.67 Temp:     98.6 degrees F oral Pulse rate:   72 / minute BP sitting:   138 / 86  (left arm) Cuff size:   regular  Vitals Entered By: Tessie Fass CMA (September 08, 2009 8:40 AM) CC: F/U thyroid Is Patient Diabetic? No Pain Assessment Patient in pain? no        Primary Care Provider:  Lynnsey Barbara MD  CC:  F/U thyroid.  History of Present Illness: cc: f/u thyroid  50 y/o F with   HypoThyroid:  Taking synthroid daily.  not feeling as tired as before.  no constipation.  no confusion.  no swelling.  no weight gain  HYPERTENSION Disease Monitoring Blood pressure range: does not check Chest pain: no Dyspnea: no Medications Mazide  Compliance:yes  Lightheadedness: no Edema: no Prevention Exercise: started exercising again 2 wks ago.  Salt restriction:trying  Obesity:  Pt has been exercising and lost 8.5 lbs since last ov in 06/2009.    Pap: Not sexually active.  Last sexual encounter years ago.  Denies any abnormal pap.  No vaginal discharge.  No "real" period 06/2008.  Now "spotting" every few months, not monthly.  Denies previous STDs.  No   States MMG 01/2009.  She will have result of MMG faxed to [facility].    Habits & Providers  Alcohol-Tobacco-Diet     Alcohol drinks/day: 0     Tobacco Status: never  Exercise-Depression-Behavior     Does Patient Exercise: yes, started exercising 2 wks ago     Type of exercise: walking, exercise tapes     Exercise (avg: min/session): 30-60     Times/week: 5  Current Medications (verified): 1)  Maxzide-25 37.5-25 Mg Tabs (Triamterene-Hctz) .... Take 1 Tablet By Mouth Once A Day 2)  Flexeril 10 Mg  Tabs (Cyclobenzaprine Hcl) .... Two Times A Day As Needed Muscle Pain 3)  Synthroid 175 Mcg Tabs (Levothyroxine Sodium) .Marland Kitchen.. 1 By Mouth Daily  Allergies (verified): No Known Drug  Allergies  Directives (verified): 1)  Fulll Code   Past History:  Past Medical History: Back- bulging disc (sees Dr Smitty Cords, [facility]) HTN hypothryoid Menorrhagia Obesity  Past Surgical History: Reviewed history from 01/04/2008 and no changes required. none    Family History: Reviewed history from 11/07/2007 and no changes required. HTN in mother, + stomach cancer in aunt, No asthma or eczema, No CAD, No DM, No female cancers (breast, cervix, uterine), aunt with leukemia   No family history of Breast or GYN cancer Uncle with throat cancer (smoker)  Social History: Reviewed history from 01/04/2008 and no changes required. Lives alone.  Grown daughter in [place].  Has mother that lives in [place]. No alcohol, tobacco or drug use;  Technician in medical supplies production;  exercises regularly; fair to poor diet.    Does Patient Exercise:  yes, started exercising 2 wks ago  Physical Exam  General:  Well-developed,well-nourished,in no acute distress; alert,appropriate and cooperative throughout examination. vitals reviewed.   Head:  normocephalic, atraumatic, and no abnormalities observed.   Eyes:  vision grossly intact.   Neck:  supple, full ROM, no masses, no thyromegaly, and no thyroid nodules or tenderness.   Lungs:  Normal respiratory effort, chest expands symmetrically. Lungs are clear to auscultation, no crackles or wheezes.  Heart:  Normal rate and regular rhythm. S1 and S2 normal without gallop, murmur, click, rub or other extra sounds. Abdomen:  Bowel sounds positive,abdomen soft and non-tender without masses, organomegaly or hernias noted. Pulses:  R radial normal, R dorsalis pedis normal, L radial normal, and L dorsalis pedis normal.   Extremities:  No clubbing, cyanosis, edema, or deformity noted with normal full range of motion of all joints.   Neurologic:  alert & oriented X3 and cranial nerves II-XII intact.   Skin:  Intact without suspicious lesions or  rashes Cervical Nodes:  No lymphadenopathy noted Psych:  Oriented X3.     Impression & Recommendations:  Problem # 1:  HYPOTHYROIDISM (ICD-244.9) Assessment Unchanged Will check TSH today.  Will adjust dose of synthroid as needed.  TSH in the past have been labile.   Her updated medication list for this problem includes:    Synthroid 175 Mcg Tabs (Levothyroxine sodium) .Marland Kitchen... 1 by mouth daily  Orders: TSH[facility] ([id number]) CBC-[facility] (09811) Basic Met-[facility] [phone]) [facility]- Est  Level 4 (13086) Lipid-[facility] ([id number])  Problem # 2:  OBESITY, NOS (ICD-278.00) Assessment: Improved Pt lost 8.5 lbs since 06/2009.  She is starting to exercise more regularly.  We discussed healthy foods options.  Will continue to encourage exercise and diet.  Orders: CBC-[facility] ([id number]) [facility]- Est  Level 4 (24401)  Problem # 3:  HYPERTENSION, BENIGN SYSTEMIC (ICD-401.1) Assessment: Unchanged BP at goal.  Will continue current meds.  Electrolytes and renal fxn wnl when checked in 06/2009.   Her updated medication list for this problem includes:    Maxzide-25 37.5-25 Mg Tabs (Triamterene-hctz) .Marland Kitchen... Take 1 tablet by mouth once a day  Orders: Basic Met-[facility] ([id number]) [facility]- Est  Level 4 (99214)  Problem # 4:  SCREENING FOR MALIGNANT NEOPLASM OF THE CERVIX (ICD-V76.2) Assessment: Unchanged Pap done today.  No history of abnormal paps.  If this is normal, can repeat in 3 yrs.   Orders: Pap Smear-[facility] ([id number]) [facility]- Est  Level 4 (38756)  Complete Medication List: 1)  Maxzide-25 37.5-25 Mg Tabs (Triamterene-hctz) .... Take 1 tablet by mouth once a day 2)  Flexeril 10 Mg Tabs (Cyclobenzaprine hcl) .... Two times a day as needed muscle pain 3)  Synthroid 175 Mcg Tabs (Levothyroxine sodium) .Marland Kitchen.. 1 by mouth daily  Other Orders: Vit D, 25 [facility] ([id number])  Patient Instructions: 1)  Please schedule a follow-up appointment in 4 months for thyroid.  2)  Congratulations on your weight loss.  Keep up the  good work. 3)  Continue all your medications unless I call.   Prevention & Chronic Care Immunizations   Influenza vaccine: Not documented    Tetanus booster: 03/19/2004: Done.   Tetanus booster due: 03/19/2014    Pneumococcal vaccine: Not documented  Colorectal Screening   Hemoccult: Not documented    Colonoscopy: Not documented  Other Screening   Pap smear: Done.  (01/13/2006)   Pap smear due: 01/14/2007    Mammogram: Done.  (09/15/2004)   Mammogram due: 09/15/2005   Smoking status: never  (09/08/2009)  Lipids   Total Cholesterol: 200  (02/08/2007)   LDL: 126  (02/08/2007)   LDL Direct: Not documented   HDL: 40  (02/08/2007)   Triglycerides: 168  (02/08/2007)    SGOT (AST): 18  (11/07/2007)   SGPT (ALT): 15  (11/07/2007)   Alkaline phosphatase: 82  (11/07/2007)   Total bilirubin: 0.5  (11/07/2007)    Lipid flowsheet reviewed?: Yes   Progress toward LDL goal: At goal  Hypertension   Last Blood Pressure: 138 / 86  (09/08/2009)   Serum creatinine: 0.80  (06/17/2009)   Serum potassium 3.7  (06/17/2009)    Hypertension flowsheet reviewed?: Yes   Progress toward BP goal: At goal  Self-Management Support :   Personal Goals (by the next clinic visit) :      Personal blood pressure goal: 140/90  (09/08/2009)     Personal LDL goal: 130  (09/08/2009)    Hypertension self-management support: Written self-care plan  (09/08/2009)   Hypertension self-care plan printed.    Lipid self-management support: Written self-care plan  (09/08/2009)   Lipid self-care plan printed.

## 2010-09-16 NOTE — Progress Notes (Signed)
Summary: [id number]  Phone Note Call from Patient Call back at [phone]   Caller: Patient Summary of Call: Pt was to call back and let Dr Janalyn Harder know if she had had a mammogram and pt found out she has not.  So one will need to be scheduled for her. Initial call taken by: Clydell Hakim,  September 08, 2009 10:07 AM  Follow-up for Phone Call        [facility] at [phone] to schedule appt for MMG. thanks.  Follow-up by: Angeline Slim MD,  September 08, 2009 10:12 AM  Additional Follow-up for Phone Call Additional follow up Details #1::        called pt and lmvm to sched. mammogram at the [facility] Additional Follow-up by: Arlyss Repress CMA,,  September 08, 2009 10:41 AM

## 2010-09-16 NOTE — Assessment & Plan Note (Signed)
Summary: f/u,df   Vital Signs:  Patient profile:   50 year old female Height:      61 inches Weight:      217 pounds BMI:     41.15 Temp:     98.1 degrees F oral Pulse rate:   69 / minute BP sitting:   114 / 85  (right arm) Cuff size:   regular  Vitals Entered By: Tessie Fass CMA (January 21, 2010 8:36 AM) CC: F/U cholesterol Is Patient Diabetic? No Pain Assessment Patient in pain? no        Primary Care Provider:  Alven Alverio MD  CC:  F/U cholesterol.  History of Present Illness: 50 y/o F here for f/u of   Hypothyroidism: Has not taken meds in over 2 wks.  She ran out of meds and did not refill them.  She thought that since she was going to come to clinic today she did not need to take her meds. She has noticed puffy eyes.  no palpitations, bowel changes, confusion, appetite changes.    HYPERTENSION Disease Monitoring: does not check  Blood pressure range: --- Medications: Mazxide Compliance: yes  Lightheadedness: no  Edema: no  Chest pain: no  Dyspnea:no Prevention Exercise: yes, but just started 2 wks ago  Salt restriction:sometimes  OBESITY BMI: 41.5 Weight difference since last OV: -8 lbs Exercise: for 2 wks now.  Walk Away the Pounds tape, up to 5 miles        How often:   almost daily    Diet: cutting back fried foods.  eating grilled and baked foods.  No sodas.  Drinking water.   Eating 3 meals daily, which is improved from before.   She is hoping that her lifestyle changes will preven her from having to take cholesterol medications.       Habits & Providers  Alcohol-Tobacco-Diet     Tobacco Status: never  Exercise-Depression-Behavior     Does Patient Exercise: yes     Exercise Counseling: to improve exercise regimen     Type of exercise: walking     Exercise (avg: min/session): <30     Times/week: 2 weeks now  Current Medications (verified): 1)  Maxzide-25 37.5-25 Mg Tabs (Triamterene-Hctz) .... Take 1 Tablet By Mouth Once A Day 2)  Synthroid 175  Mcg Tabs (Levothyroxine Sodium) .Marland Kitchen.. 1 By Mouth Daily  Allergies (verified): No Known Drug Allergies  Past History:  Past Surgical History: Last updated: 01/04/2008 none    Family History: Last updated: 09/08/2009 HTN in mother, + stomach cancer in aunt, No asthma or eczema, No CAD, No DM, No female cancers (breast, cervix, uterine), aunt with leukemia   No family history of Breast or GYN cancer Uncle with throat cancer (smoker)  Social History: Last updated: 09/08/2009 Lives alone.  Grown daughter in [place].  Has mother that lives in [place]. No alcohol, tobacco or drug use;  Technician in medical supplies production;  exercises regularly; fair to poor diet.      Risk Factors: Alcohol Use: 0 (09/08/2009) Exercise: yes (01/21/2010)  Risk Factors: Smoking Status: never (01/21/2010)  Past Medical History: Back- bulging disc (sees Dr Smitty Cords, [facility]) HTN hypothryoid Menorrhagia Obesity ***Medical Noncompliance  Social History: Does Patient Exercise:  yes  Review of Systems       per hpi  Physical Exam  General:  Well-developed,well-nourished,in no acute distress; alert,appropriate and cooperative throughout examination. vitals reviewed Head:  Normocephalic and atraumatic without obvious abnormalities. No apparent alopecia or  balding. Neck:  supple, full ROM, and no masses.   Lungs:  Normal respiratory effort, chest expands symmetrically. Lungs are clear to auscultation, no crackles or wheezes. Heart:  Normal rate and regular rhythm. S1 and S2 normal without gallop, murmur, click, rub or other extra sounds. Abdomen:  Bowel sounds positive,abdomen soft and non-tender without masses, organomegaly or hernias noted. obese Msk:  No deformity or scoliosis noted of thoracic or lumbar spine.   Pulses:  R radial normal, R dorsalis pedis normal, R carotid normal, L radial normal, L dorsalis pedis normal, and L carotid normal.   Extremities:  No clubbing, cyanosis, edema, or  deformity noted with normal full range of motion of all joints.   Neurologic:  No cranial nerve deficits noted. Station and gait are normal. Plantar reflexes are down-going bilaterally. DTRs are symmetrical throughout. Sensory, motor and coordinative functions appear intact. Skin:  Intact without suspicious lesions or rashes Cervical Nodes:  No lymphadenopathy noted Axillary Nodes:  No palpable lymphadenopathy   Impression & Recommendations:  Problem # 1:  HYPERTENSION, BENIGN SYSTEMIC (ICD-401.1) Assessment Improved BP at goal since pt has started taking meds consistently.  Will not make changes.  F/u in 3 months Her updated medication list for this problem includes:    Maxzide-25 37.5-25 Mg Tabs (Triamterene-hctz) .Marland Kitchen... Take 1 tablet by mouth once a day  Orders: [facility]- Est  Level 4 (54098)  Problem # 2:  HYPOTHYROIDISM (ICD-244.9) Assessment: Unchanged Has not taken med in 2 wks.  Will check TSH today.  Will recheck in 6 wks if TSH abnormal.   Her updated medication list for this problem includes:    Synthroid 175 Mcg Tabs (Levothyroxine sodium) .Marland Kitchen... 1 by mouth daily  Orders: TSH-[facility] (11914-78295) [facility]- Est  Level 4 ([address]130)  Problem # 3:  OBESITY, NOS (ICD-278.00) Lost 8 lbs since last visit.  BMI 41 vs 42.  Pt very motivated to eat healthier so that she does not have to take HLD meds.  She just started exercising 2 wks ago.   Orders: Direct LDL-[facility] (86578-46962) [facility]- Est  Level 4 ([address]284)  Problem # 4:  HYPERLIPIDEMIA (ICD-272.4) Assessment: Comment Only Checking direct ldl today.  pt has made lifestyle changes for the past few months.  Complete Medication List: 1)  Maxzide-25 37.5-25 Mg Tabs (Triamterene-hctz) .... Take 1 tablet by mouth once a day 2)  Synthroid 175 Mcg Tabs (Levothyroxine sodium) .Marland Kitchen.. 1 by mouth daily  Patient Instructions: 1)  Please schedule a follow-up appointment in 3 months.  2)  I will call you with cholesterol and thyroid result. 3)  If your TSH  is not normal we will have to recheck in 6 wks. 4)  Congrats on 8 lbs weight lost. 5)  It is important that you exercise reguarly at least 20 minutes 5 times a week. If you develop chest pain, have severe difficulty breathing, or feel very tired, stop exercising immediately and seek medical attention.  Prescriptions: MAXZIDE-25 37.5-25 MG TABS (TRIAMTERENE-HCTZ) Take 1 tablet by mouth once a day  #34 x 6   Entered and Authorized by:   Angeline Slim MD   Signed by:   Angeline Slim MD on 01/21/2010   Method used:   Electronically to        [facility] [address] [phone]* (retail)       [address]       [place], [place]  [address]       Ph: [phone]  Fax: [phone]   RxID:   [id number] SYNTHROID 175 MCG TABS (LEVOTHYROXINE SODIUM) 1 by mouth daily  #34 x 6   Entered and Authorized by:   Angeline Slim MD   Signed by:   Angeline Slim MD on 01/21/2010   Method used:   Electronically to        [facility] [phone]* (retail)       [address]       [place], [place]  [address]       Ph: [phone]       Fax: [phone]   RxID:   [id number]    Prevention & Chronic Care Immunizations   Influenza vaccine: Not documented    Tetanus booster: 03/19/2004: Done.   Tetanus booster due: 03/19/2014    Pneumococcal vaccine: Not documented  Colorectal Screening   Hemoccult: Not documented    Colonoscopy: Not documented  Other Screening   Pap smear: NEGATIVE FOR INTRAEPITHELIAL LESIONS OR MALIGNANCY.  (09/08/2009)   Pap smear due: 01/14/2007    Mammogram: ASSESSMENT: Negative - BI-RADS 1^MM DIGITAL SCREENING  (09/15/2009)   Mammogram due: 09/15/2005   Smoking status: never  (01/21/2010)  Lipids   Total Cholesterol: 186  (09/08/2009)   LDL: 125  (09/08/2009)   LDL Direct: Not documented   HDL: 39  (09/08/2009)   Triglycerides: 111  (09/08/2009)    SGOT (AST): 18  (11/07/2007)   SGPT (ALT): 15  (11/07/2007)   Alkaline phosphatase: 82  (11/07/2007)   Total bilirubin: 0.5   (11/07/2007)    Lipid flowsheet reviewed?: Yes   Progress toward LDL goal: At goal  Hypertension   Last Blood Pressure: 114 / 85  (01/21/2010)   Serum creatinine: 0.69  (09/08/2009)   Serum potassium 3.9  (09/08/2009)    Hypertension flowsheet reviewed?: Yes   Progress toward BP goal: At goal  Self-Management Support :   Personal Goals (by the next clinic visit) :      Personal blood pressure goal: 140/90  (09/08/2009)     Personal LDL goal: 130  (09/08/2009)    Hypertension self-management support: Written self-care plan  (09/08/2009)    Lipid self-management support: Written self-care plan  (09/08/2009)

## 2010-09-16 NOTE — Progress Notes (Signed)
Summary: phn msg  Phone Note Call from Patient Call back at Home Phone [phone]   Caller: Patient Summary of Call: pt is wanting to get her thyroid and cholesterol checked - wants to know if she can come in this week. Initial call taken by: De Nurse,  April 01, 2010 9:52 AM  Follow-up for Phone Call        called and let pt know to make appt for labs Follow-up by: De Nurse,  April 02, 2010 12:06 PM    She can get thyroid checked this week.  Cholesterol was last checked in June so not due for another year.

## 2010-09-16 NOTE — Assessment & Plan Note (Signed)
Summary: discuss menses/bmc   Vital Signs:  Patient profile:   50 year old female Height:      61 inches Weight:      224 pounds BMI:     42.48 Temp:     98.7 degrees F oral BP sitting:   131 / 84  (left arm) Cuff size:   regular  Vitals Entered By: Tessie Fass CMA (June 16, 2010 3:38 PM) CC: spotting for 3 months Is Patient Diabetic? No Pain Assessment Patient in pain? no        Primary Care Provider:  Cat Ta MD  CC:  spotting for 3 months.  History of Present Illness: 50 y/o F is here for spotting x 3 months.  For the last 3 yrs she has one normal period once a year, each time lasting 3 days.  During this past 3 yrs she would have spotting periodically.  But for the last 3 months she has been bleeding everyday.  Bleeding is very light, more like spotting.    +Night sweats x 1 yr.  Only one time it was bad enough that she wanted to take her clothes off. No irritability. No vaginal dryness. Not sexually active. Weight gain +12 lbs since 04/2010.  No fatigue, shortness of breath, lightheadedness, abd pain, pelvic pain, abd weight loss.   ***Pt brought in Outside labs from her work 05/28/10 with Hb 12.8, HCT 39.1    Habits & Providers  Alcohol-Tobacco-Diet     Tobacco Status: never  Current Medications (verified): 1)  Maxzide-25 37.5-25 Mg Tabs (Triamterene-Hctz) .... Take 1 Tablet By Mouth Once A Day 2)  Synthroid 125 Mcg Tabs (Levothyroxine Sodium) .Marland Kitchen.. 1 Tab By Mouth Daily (New Dose) 3)  Ultram 50 Mg Tabs (Tramadol Hcl) .Marland Kitchen.. 1 Tab By Mouth Three Times A Day As Needed Severe Pain 4)  Flexeril 10 Mg Tabs (Cyclobenzaprine Hcl) .Marland Kitchen.. 1 Tab By Mouth Three Times A Day As Needed Muscle Spasm. May Cause Drowsiness.  Allergies (verified): No Known Drug Allergies  Review of Systems       per hpi   Physical Exam  General:  Well-developed,well-nourished,in no acute distress; alert,appropriate and cooperative throughout examination. vitals reviewed  Neurologic:   alert & oriented X3.     Impression & Recommendations:  Problem # 1:  IRREGULAR MENSES (ICD-626.4) Normal pap 08/2009.  Pt having irregular menses for 3 yrs with one period a year but spotting intermittently.  She has spotted for the last 3 yrs.  Outside labs showed normal Hb.  Pt declined further work up (Hb, exam) or treatment to stop bleeding.  Likely perimenopausal symptoms and increased weight of 12 lbs during this period may have something to do with it also.  Will monitor for now.  Pt to let me know if symptoms becomes worse (becomes symptomatic).    Orders: [facility]- Est Level  3 ([address])  Complete Medication List: 1)  Maxzide-25 37.5-25 Mg Tabs (Triamterene-hctz) .... Take 1 tablet by mouth once a day 2)  Synthroid 125 Mcg Tabs (Levothyroxine sodium) .Marland Kitchen.. 1 tab by mouth daily (new dose) 3)  Ultram 50 Mg Tabs (Tramadol hcl) .Marland Kitchen.. 1 tab by mouth three times a day as needed severe pain 4)  Flexeril 10 Mg Tabs (Cyclobenzaprine hcl) .Marland Kitchen.. 1 tab by mouth three times a day as needed muscle spasm. may cause drowsiness.  Patient Instructions: 1)  Please schedule a follow-up appointment in January.  2)  It is important that you exercise reguarly at least  30 minutes 5 times a week. If you develop chest pain, have severe difficulty breathing, or feel very tired, stop exercising immediately and seek medical attention. Walk so that you cannot carry a normal conversation.    Orders Added: 1)  [facility]- Est Level  3 [[address]]

## 2010-09-16 NOTE — Miscellaneous (Signed)
Summary: Outside labs [employer]) Drawn from work, brought in by pt  Clinical Lists Changes  Observations: Added new observation of ABSOLUTE BAS: 0.1 K/uL (05/28/2010 10:27) Added new observation of EOS ABSLT: 0.5 K/uL (05/28/2010 10:27) Added new observation of ABSOLUTE MON: 0.5 K/uL (05/28/2010 10:27) Added new observation of ABS LYMPHOCY: 1.6 K/uL (05/28/2010 10:27) Added new observation of ABS NEUTROPH: 4.9 K/uL (05/28/2010 10:27) Added new observation of PLATELETK/UL: 351 K/uL (05/28/2010 10:27) Added new observation of RDW: 14.1 % (05/28/2010 10:27) Added new observation of MCHC RBC: 32.7 g/dL (14/78/2956 21:30) Added new observation of MCV: 83 fL (05/28/2010 10:27) Added new observation of HCT: 39.1 % (05/28/2010 10:27) Added new observation of HGB: 12.8 g/dL (86/57/8469 62:95) Added new observation of RBC M/UL: 4.72 M/uL (05/28/2010 10:27) Added new observation of WBC COUNT: 7.6 10*3/microliter (05/28/2010 10:27) Added new observation of VLDL: 31 mg/dL (28/41/3244 01:02) Added new observation of LDL: 114 mg/dL (72/53/6644 03:47) Added new observation of HDL: 42 mg/dL (42/59/5638 75:64) Added new observation of TRIGLYC TOT: 154 mg/dL (33/29/5188 41:66) Added new observation of CHOLESTEROL: 187 mg/dL (02/12/1600 09:32) Added new observation of URIC ACID: 4.7 mg/dL (35/57/3220 25:42) Added new observation of CALCIUM: 9.2 mg/dL (70/62/3762 83:15) Added new observation of ALBUMIN: 4.1 g/dL (17/61/6073 71:06) Added new observation of PROTEIN, TOT: 6.9 g/dL (26/94/8546 27:03) Added new observation of SGPT (ALT): 18 units/L (05/28/2010 10:27) Added new observation of SGOT (AST): 20 units/L (05/28/2010 10:27) Added new observation of ALK PHOS: 86 units/L (05/28/2010 10:27) Added new observation of BILI TOTAL: 0.3 mg/dL (50/04/3817 29:93) Added new observation of GFR: 119 mL/min (05/28/2010 10:27) Added new observation of CREATININE: 0.66 mg/dL (71/69/6789 38:10) Added new observation of  BUN: 17 mg/dL (17/51/0258 52:77) Added new observation of BG RANDOM: 112 mg/dL (82/42/3536 14:43) Added new observation of CO2 PLSM/SER: 25 meq/L (05/28/2010 10:27) Added new observation of CL SERUM: 102 meq/L (05/28/2010 10:27) Added new observation of K SERUM: 3.8 meq/L (05/28/2010 10:27) Added new observation of NA: 139 meq/L (05/28/2010 10:27)

## 2010-09-16 NOTE — Progress Notes (Signed)
Summary: called pt/fyi/ts  Phone Note Call from Patient Call back at Home Phone [phone]   Caller: Patient Summary of Call: Pt is returning call and said if she was unable to be reached it is ok to leave a message. Initial call taken by: Clydell Hakim,  January 22, 2010 3:18 PM  Follow-up for Phone Call        called pt and informed of labs. see message from dr.ta. pt said, that she does not want to take chol meds at this time, because she will exercise and watch her diet. fwd. to dr.ta  Follow-up by: Arlyss Repress CMA,,  January 22, 2010 4:40 PM

## 2010-09-16 NOTE — Letter (Signed)
Summary: Results Follow-up Letter  [facility]  [address]   [place], [place] [address]   Phone: [phone]  Fax: [phone]    09/09/2009  [address] [place], [place]  [address]  Dear Ms. Delford Field,   The following are the results of your recent test(s):  Test     Result     Pap Smear    Normal  _________________________________________________________ Cholesterol  LDL(Bad cholesterol):   129       Your goal is less than:   100      HDL (Good cholesterol):  39      Your goal is more than:  45 _________________________________________________________ Other Tests:  Kidney function:  Normal Electrolytes:  Normal Hemoglobin:  Normal  _________________________________________________________   Please call the [facility] so we can discuss your options to lower your cholesterol.    Sincerely,  Adrieanna Boteler MD [facility]           Appended Document: Results Follow-up Letter mailed.

## 2010-09-16 NOTE — Letter (Signed)
Summary: Out of Work  [facility]  [address]   [place], [place] [address]   Phone: [phone]  Fax: [phone]    April 29, 2010   Employee:  SORINA DERRIG    To Whom It May Concern:   For Medical reasons, please excuse the above named employee from work for the following dates:  Start:   April 29, 2010  End:   Can go back to work on Tues, Sept 20, 2011.   If you need additional information, please feel free to contact our office.         Sincerely,    Aras Albarran MD

## 2010-09-16 NOTE — Progress Notes (Signed)
Summary: Result of Labs  Phone Note Outgoing Call   Call placed by: Alycea Segoviano MD,  January 22, 2010 9:50 AM Call placed to: Patient Summary of Call: Called pt about results:  1.  TSH: high (abnormal): most likley from not taking med for the past 2 weeks.  Will need to continue her med and recheck TSH in 6 wks.  2.  Direct LDL: higher than fasting LDL.  Would most likely need to start cholesterol medication.   Initial call taken by: Tywan Siever MD,  January 22, 2010 9:51 AM

## 2010-10-15 ENCOUNTER — Other Ambulatory Visit: Payer: Self-pay | Admitting: Family Medicine

## 2010-10-15 NOTE — Telephone Encounter (Signed)
Please review and refill

## 2010-10-25 ENCOUNTER — Ambulatory Visit (INDEPENDENT_AMBULATORY_CARE_PROVIDER_SITE_OTHER): Payer: Self-pay | Admitting: Family Medicine

## 2010-10-25 ENCOUNTER — Encounter: Payer: Self-pay | Admitting: Family Medicine

## 2010-10-25 DIAGNOSIS — I10 Essential (primary) hypertension: Secondary | ICD-10-CM

## 2010-10-25 DIAGNOSIS — E669 Obesity, unspecified: Secondary | ICD-10-CM

## 2010-10-25 DIAGNOSIS — E039 Hypothyroidism, unspecified: Secondary | ICD-10-CM

## 2010-10-25 LAB — BASIC METABOLIC PANEL
CO2: 27 mEq/L (ref 19–32)
Calcium: 9.5 mg/dL (ref 8.4–10.5)
Creat: 0.71 mg/dL (ref 0.40–1.20)
Glucose, Bld: 91 mg/dL (ref 70–99)

## 2010-10-25 LAB — CONVERTED CEMR LAB
BUN: 10 mg/dL (ref 6–23)
Calcium: 9.5 mg/dL (ref 8.4–10.5)
Creatinine, Ser: 0.71 mg/dL (ref 0.40–1.20)
TSH: 1.672 microintl units/mL (ref 0.350–4.500)

## 2010-10-25 NOTE — Assessment & Plan Note (Signed)
Weight loss of 4 lbs since last visit.  Congratulated pt.  Pt will continue to walk and exercise with Wii Fit.  Encouraged healthier diet.

## 2010-10-25 NOTE — Assessment & Plan Note (Addendum)
TSH 06/2010 was 0.933 which is within normal limits.  Will check TSH today.  Pt has history of noncompliance in the past that has made it difficult to control thyroid.  Will call pt after TSH is resulted and adjust dose of levothryoxine appropriately.  Pt can come back in 3 months to have TSH checked, but not not need to come in for appt.  She is agreeable to plan.

## 2010-10-25 NOTE — Patient Instructions (Signed)
Please make an appointment for 6 months for blood pressure, thyroid. Take all your medications as directed. We will check cholesterol Nov 2012. Continue to walk and work out with the Wii. Add Aspirin 81mg  to your med daily.

## 2010-10-25 NOTE — Assessment & Plan Note (Signed)
BP 132/93 today.  DBP elevated from last appt in Nov when it was in the 80s.  Pt has not missed any doses.  Past DBPs have been in 80s-90s.  Will continue current med.  Pt will check BP at her work and call us with result.  Will check bmet today.

## 2010-10-25 NOTE — Progress Notes (Signed)
  Subjective:    Patient ID: Courtney Collins, female    DOB: [birth year], 50 y.o.   MRN: [id number]  HPI Abn bleeding: Spotting for about a year.  Spotting almost everyday.  Never had a time when menses stopped for one or more months.    Obesity Weight change: -4lbs since last visit Nov 2011 BMI: 37.7, which is improved from last visit Exercise: walking daily and playing with Wii Fit Diet: still needs to have healthier diet  Hypothyroidism Courtney Collins is a 50 y.o. female who presents for follow up of hypothyroidism. Current symptoms: none . Patient denies diarrhea, heat / cold intolerance, nervousness and palpitations. Symptoms have been well-controlled.  Last TSH 0.933 06/2010.  Will check TSH today.  If stable, will check again in 6 months.    Subjective:  Courtney Collins is a 50 y.o. female with hypertension. Current Outpatient Prescriptions  Medication Sig Dispense Refill  . cyclobenzaprine (FLEXERIL) 10 MG tablet Take 10 mg by mouth 3 (three) times daily as needed.        Marland Kitchen levothyroxine (SYNTHROID, LEVOTHROID) 125 MCG tablet take 1 tablet by mouth once daily  30 tablet  3  . traMADol (ULTRAM) 50 MG tablet Take 50 mg by mouth 3 (three) times daily as needed.        . triamterene-hydrochlorothiazide (MAXZIDE-25) 37.5-25 MG per tablet Take 1 tablet by mouth daily.          Hypertension ROS: taking medications as instructed, no medication side effects noted, no TIA's, no chest pain on exertion, no dyspnea on exertion and no swelling of ankles.  New concerns: cholesterol.  Pt would like to follow cholesterol yearly so that it can be monitored.  Last FLP 06/2010.   Objective:  BP 132/93  Pulse 90  Temp(Src) 98.1 F (36.7 C) (Oral)  Ht 5\' 3"  (1.6 m)  Wt 213 lb (96.616 kg)  BMI 37.73 kg/m2  Appearance alert, well appearing, and in no distress. General exam BP noted to be well controlled today in office, S1, S2 normal, no gallop, no murmur, chest clear, no JVD, no HSM, no  edema.  Lab review: labs are reviewed, up to date and normal, most recent lipid panel reviewed, showing LDL (114)result meets goal, HDL (42) low, triglycerides norma (154)l, orders written for new lab studies as appropriate; see orders.   Assessment:   Hypertension stable, no significant medication side effects noted and needs to follow diet more regularly.   Plan:  Current treatment plan is effective, no change in therapy..  Review of Systems     Objective:   Physical Exam        Assessment & Plan:

## 2010-10-28 LAB — RAPID STREP SCREEN (MED CTR MEBANE ONLY): Streptococcus, Group A Screen (Direct): POSITIVE — AB

## 2010-11-24 ENCOUNTER — Other Ambulatory Visit: Payer: Self-pay | Admitting: Family Medicine

## 2010-11-24 DIAGNOSIS — I10 Essential (primary) hypertension: Secondary | ICD-10-CM

## 2010-11-24 NOTE — Telephone Encounter (Signed)
Refill request

## 2010-11-25 ENCOUNTER — Ambulatory Visit (INDEPENDENT_AMBULATORY_CARE_PROVIDER_SITE_OTHER): Payer: BC Managed Care – PPO | Admitting: Family Medicine

## 2010-11-25 ENCOUNTER — Encounter: Payer: Self-pay | Admitting: Family Medicine

## 2010-11-25 VITALS — BP 130/86 | HR 88 | Wt 214.9 lb

## 2010-11-25 DIAGNOSIS — M7550 Bursitis of unspecified shoulder: Secondary | ICD-10-CM

## 2010-11-25 DIAGNOSIS — M75100 Unspecified rotator cuff tear or rupture of unspecified shoulder, not specified as traumatic: Secondary | ICD-10-CM

## 2010-11-25 HISTORY — DX: Bursitis of unspecified shoulder: M75.50

## 2010-11-25 NOTE — Progress Notes (Signed)
Left shoulder pain x1 day. Has history of left shoulder pain due to subacromial bursitis relieved with steroid injection. She denies any injury that caused her pain. She notes that she was lifting some heavy objects yesterday and she thinks this may have worsened her pain. She notes that reaching up high and back her her shoulder. No hand numbness or weakness.   ROS: As above. No fevers or chills.   VS noted Gen: Well NAD MSK: Left shoulder normal to inspection or palpation.  ROM is normal right and limited by pain on the left. Abduction 110 deg, FF 130 deg. Int rot 90 and ext rot 80.  Strength is 5/5 right and 4/5 left to SS testing. Ext and int rot strength are 5/5. Empty can pos, Hawkins pos, neers postiive  Following injection pain much better.    Consent obtained. Landmarks palpated and marked with indention.  Area cleaned with betadine and cold spray applied. 3ml of lidocaine and 40mg  kenalog placed into the subacromial bursa with a 23 gauge 1.5 inch needle. No bleeding. Patient tolerated the procedure well.

## 2010-11-25 NOTE — Assessment & Plan Note (Signed)
Think burisits due to positive tests and history. Pain better with injection.  Plan: Injection as above. Handout on ROM exercises given. Discussed red flags. Will follow up with Dr. Janalyn Harder PRN and in 6 months.

## 2010-11-25 NOTE — Patient Instructions (Signed)
Thank you for coming in today. Your shoulder will hurt more tonight but by tomorrow or the next day it should feel a lot better.  Let me know if you have fevers or chills or worse shoulder pain.

## 2011-01-12 ENCOUNTER — Encounter: Payer: Self-pay | Admitting: Family Medicine

## 2011-01-12 ENCOUNTER — Ambulatory Visit (INDEPENDENT_AMBULATORY_CARE_PROVIDER_SITE_OTHER): Payer: BC Managed Care – PPO | Admitting: Family Medicine

## 2011-01-12 VITALS — BP 133/90 | HR 85 | Temp 97.7°F | Wt 207.0 lb

## 2011-01-12 DIAGNOSIS — M62830 Muscle spasm of back: Secondary | ICD-10-CM

## 2011-01-12 DIAGNOSIS — M5380 Other specified dorsopathies, site unspecified: Secondary | ICD-10-CM

## 2011-01-12 HISTORY — DX: Muscle spasm of back: M62.830

## 2011-01-12 MED ORDER — NAPROXEN SODIUM 275 MG PO TABS: 275.0000 mg | ORAL_TABLET | Freq: Every day | ORAL | Status: DC

## 2011-01-12 MED ORDER — CYCLOBENZAPRINE HCL 5 MG PO TABS: 5.0000 mg | ORAL_TABLET | Freq: Three times a day (TID) | ORAL | Status: DC | PRN

## 2011-01-12 NOTE — Progress Notes (Signed)
  Subjective:    Patient ID: Courtney Collins, female    DOB: 11/12/1960, 50 y.o.   MRN: [id number]  HPI Lower back pain x 5 days She was moving to another home last Friday and may have "over done it".  She carried things up 1 flight of stairs.  Pain in lower back. Patient presents for presents evaluation of low back problems.  Symptoms have been present for 5 days and include stiffness in lower back.. Initial inciting event: moving from her home.. Symptoms are worst: all day. Alleviating factors identifiable by patient are none. Exacerbating factors identifiable by patient are walking. Treatments so far initiated by patient: none Previous lower back problems: none. Previous workup: none. Previous treatments: none.   No fever, chills, nausea, vomiting, saddle distribution paresthesia, leg pain, immunosuppression therapy, history of cancer.    Review of Systems Per hpi     Objective:   Physical Exam GEN: NAD, AOX3, Appropriate Back: Inspection: no abnormality, redness, swelling Palpation of entire spine: no tenderness ROM: full rom, but mild tenderness with extension Sitting and straight leg raises wnl Hip stable Gait stable       Assessment & Plan:

## 2011-01-13 NOTE — Assessment & Plan Note (Signed)
Low back pain x 5 days likely muscle spasm from moving from her home.  Will Rx naproxen and muscle relaxant to take at bedtime.

## 2011-03-02 ENCOUNTER — Other Ambulatory Visit: Payer: Self-pay | Admitting: Family Medicine

## 2011-03-02 DIAGNOSIS — E039 Hypothyroidism, unspecified: Secondary | ICD-10-CM

## 2011-03-02 NOTE — Telephone Encounter (Signed)
Message from MD  left on voicemail .

## 2011-03-02 NOTE — Telephone Encounter (Signed)
Please inform pt that I have refilled x1, pt needs to have TSH checked for further refills-- I am fine with just getting lab work and then seeing me in 3 months for office visit.

## 2011-03-02 NOTE — Telephone Encounter (Signed)
Refill request

## 2011-04-25 ENCOUNTER — Encounter: Payer: Self-pay | Admitting: Family Medicine

## 2011-04-25 ENCOUNTER — Ambulatory Visit (INDEPENDENT_AMBULATORY_CARE_PROVIDER_SITE_OTHER): Payer: BC Managed Care – PPO | Admitting: Family Medicine

## 2011-04-25 VITALS — BP 157/94 | HR 90 | Temp 98.6°F | Wt 212.0 lb

## 2011-04-25 DIAGNOSIS — H8110 Benign paroxysmal vertigo, unspecified ear: Secondary | ICD-10-CM

## 2011-04-25 HISTORY — DX: Benign paroxysmal vertigo, unspecified ear: H81.10

## 2011-04-25 MED ORDER — MECLIZINE HCL 25 MG PO TABS: 25.0000 mg | ORAL_TABLET | Freq: Three times a day (TID) | ORAL | Status: AC | PRN

## 2011-04-25 NOTE — Patient Instructions (Signed)
Do the reposition movement today several times Take the medication as needed Drink a lot of water  Benign Positional Vertigo (BPV) Vertigo is a feeling that you are unsteady or that you or your surroundings are moving. Benign positional vertigo (BPV) is the most common form of vertigo. Benign means it does not have a serious cause. It is an upset in the balance system in your middle ear. This is troublesome but usually not serious. A viral infection or head injury are common causes, but often no cause is found. It is more common as we grow older. SYMPTOMS Sudden dizziness happens when you move your head in different directions. Some of the problems that come with this are:  Loss of balance  Throwing up   Blurred vision   Dizziness   Feeling sick to your stomach   DIAGNOSIS Your caregiver may do some specialized testing to prove what is wrong. HOME CARE INSTRUCTIONS  Rest and eat a well balanced diet.   Move slowly and do not make sudden body or head movements.   Do not drive a car or do any activities that could hurt you or others.   Lie down and rest. Take precautions to prevent falls.  SEEK IMMEDIATE MEDICAL CARE IF:  You develop headaches which are severe or lasting.   You develop continued vomiting.   A temporary loss or change of vision appears.   You notice temporary numbness on one side of your body.   You are temporarily unable to speak.   Temporary areas of weakness develop.   You have weakness or numbness in the face, arms, or legs.   You notice dizziness or difficulty walking.   You experience slurred speech or difficulty swallowing.  MAKE SURE YOU:   Understand these instructions.   Will watch your condition.   Will get help right away if you are not doing well or get worse.  Document Released: 05/09/2006 Document Re-Released: 11/17/2008 [facility] Patient Information 2011 [place], [place].

## 2011-04-25 NOTE — Progress Notes (Signed)
  Subjective:    Patient ID: Courtney Collins, female    DOB: 05/05/1961, 50 y.o.   MRN: [id number]  HPI Room spinning when laying down for one day.  Vomiting and nausea associated.  Had vertigo once before but not this bad.  Denies hearing loss or tinnitus.    Review of Systems  Constitutional: Positive for activity change.  Neurological: Positive for dizziness.       Objective:   Physical Exam  Constitutional: She appears well-developed and well-nourished.  Neurological: No cranial nerve deficit.       EOM full but symptomatic, could not do hallpikes as she could not open eyes when laying down.            Assessment & Plan:

## 2011-04-25 NOTE — Assessment & Plan Note (Signed)
Treat with meclizine, out of work, printout.  If persists will refer to PT, discussed repositioning procedure

## 2011-05-04 ENCOUNTER — Telehealth: Payer: Self-pay | Admitting: Family Medicine

## 2011-05-04 NOTE — Telephone Encounter (Signed)
Went back to work on Monday to regular duties and was not able to do the job - wants to know if she should continue with meds and could she have a note for light duty until next time she sees doctor (10/4) They have put her out of work until she gets a note.

## 2011-05-04 NOTE — Telephone Encounter (Signed)
Will forward to Liana Crocker who saw her last.

## 2011-05-04 NOTE — Telephone Encounter (Signed)
Yes, but she needs to get in here to be seen.  Vertigo should be resolving at this point.  If not she will need further investigation and possible physical therapy for repositioning exercises.

## 2011-05-05 NOTE — Telephone Encounter (Signed)
Called pt and advised of S.Saxon's recommendations. Pt agreed and will schedule appt. Lorenda Hatchet, Renato Battles

## 2011-05-06 ENCOUNTER — Ambulatory Visit (INDEPENDENT_AMBULATORY_CARE_PROVIDER_SITE_OTHER): Payer: BC Managed Care – PPO | Admitting: Family Medicine

## 2011-05-06 ENCOUNTER — Encounter: Payer: Self-pay | Admitting: Family Medicine

## 2011-05-06 VITALS — BP 142/98 | HR 89 | Temp 98.0°F | Wt 213.9 lb

## 2011-05-06 DIAGNOSIS — H8110 Benign paroxysmal vertigo, unspecified ear: Secondary | ICD-10-CM

## 2011-05-06 MED ORDER — MECLIZINE HCL 25 MG PO TABS: 25.0000 mg | ORAL_TABLET | Freq: Three times a day (TID) | ORAL | Status: DC | PRN

## 2011-05-06 NOTE — Assessment & Plan Note (Signed)
Symptoms consistent with BPPV with vertigo instigated with position changes and without any other deficits. Will refer to PT for vestibular rehabilitation. Filled-out form for work to be excused. Date of return to work pending PT evaluation.  Continue meclizine prn.

## 2011-05-06 NOTE — Patient Instructions (Signed)
Continue to take the meclizine as needed.  We will set you up with physical therapy for "vestibular rehabilitation" to help your dizziness.  If your symptoms get worse and you have difficulty hearing, walking, or persistent headaches, go to the ED if severe or return to the clinic if not.

## 2011-05-06 NOTE — Progress Notes (Signed)
  Subjective:    Patient ID: Courtney Collins, female    DOB: 09-02-1960, 50 y.o.   MRN: [id number]  HPI Patient here for follow-up of her vertigo.  Persistent.  Happens when she lies flat or moves around suddenly.  Often causes vomiting. Vomited today. Has not been able to work since 09/18 due to symptoms (last went to work on 09/17). Operates machinery at work.  Meclizine helps vertigo but causes sleepiness. Took over the weekend and helped. But may be because did not move around as much.  Tried Epley's maneuvers at home a couple of times, but made her feel sick.   Review of Systems Denies changes in vision/hearing, difficultly walking, numbness, recent headache, falls.     Objective:   Physical Exam Gen: NAD HEENT:   Eyes: no nystagmus during Dix-Halpike maneuver. Some nausea but no vomiting during maneuver. No nystagmus when checking EOM, which were intact.    Ears: TM clear   Mouth: no oropharyngeal lesions   Neck: no LAD Neuro: Romberg negative, gait stable, no focal deficits, FTN intact but slow, HTS intact.     Assessment & Plan:

## 2011-05-11 ENCOUNTER — Telehealth: Payer: Self-pay | Admitting: Family Medicine

## 2011-05-11 NOTE — Telephone Encounter (Signed)
Pt is wanting to know when PT appt is?

## 2011-05-11 NOTE — Telephone Encounter (Signed)
Pt informed we faxed the referral 2 days ago and that they will call her with the appt .Dreshaun Stene, Maryjo Rochester

## 2011-05-12 ENCOUNTER — Telehealth: Payer: Self-pay | Admitting: Family Medicine

## 2011-05-12 NOTE — Telephone Encounter (Signed)
Called vestibular rehabilitation. [phone]. Patient does not have appointment yet. Asked them to call and leave a message regarding date of appointment.  Patient is currently not able to work due to this problem and extension of work-excuse depends on PT referral. I will call PT after her evaluation to kind of get an idea of how long we expect her to be out of work.

## 2011-05-12 NOTE — Telephone Encounter (Signed)
The nurse is calling back to let Dr. Madolyn Frieze know the appt will be on 10/8 at 11:15.  She will need to arrive 30 minutes early.  She needs to bring picture id, insurance card and a list of all of her meds.  The patient has not been notified of this.  They are relying on [facility] to inform the patient.

## 2011-05-13 ENCOUNTER — Telehealth (HOSPITAL_COMMUNITY): Payer: Self-pay | Admitting: Family Medicine

## 2011-05-13 NOTE — Telephone Encounter (Signed)
Completed and placed up front.  Subsequent paper work to be done by primary MD.  If patient has this request she must have an apt with Dr. Fara Boros

## 2011-05-13 NOTE — Telephone Encounter (Signed)
Pt dropped ins  document to be complete and fax to [employer]

## 2011-05-13 NOTE — Telephone Encounter (Signed)
Pt informed. Courtney Collins Dawn  

## 2011-05-16 ENCOUNTER — Telehealth: Payer: Self-pay | Admitting: Family Medicine

## 2011-05-16 NOTE — Telephone Encounter (Signed)
Courtney Collins is needing a note faxed to her workplace saying it is ok for her to return the week. The fax # for work is [phone].  Also in section D of page 3 the form for the insurance, she needs yes checked in the section where it asks if she needs immediate time off and the start date and end date for at least 3 months in case the problem with Vertigo occurs again.  She also wanted to thank Dr. Fara Boros for doing this for her.  She knows it must be an inconvenience.

## 2011-05-16 NOTE — Telephone Encounter (Signed)
Pt states she went back to work today-no symptoms,will drop off form that needs checked off.PT appt is 05/23/11 for eval.

## 2011-05-16 NOTE — Telephone Encounter (Signed)
Will forward to Dr McGill 

## 2011-05-16 NOTE — Telephone Encounter (Signed)
Did pt have PT referral? I never saw pt for this, but based on Dr. Bluford K[place]mann [place]'s note from 9/21, pt's return to work was dependent on PT assessment.  Also, is there an actual worm that needs to be filled out or just a note needs to be written on EPIC?

## 2011-05-17 NOTE — Telephone Encounter (Signed)
I didn't have any forms to fill out for this today.  I'll check again tomorrow.

## 2011-05-19 ENCOUNTER — Ambulatory Visit (INDEPENDENT_AMBULATORY_CARE_PROVIDER_SITE_OTHER): Payer: BC Managed Care – PPO | Admitting: Family Medicine

## 2011-05-19 ENCOUNTER — Encounter: Payer: Self-pay | Admitting: Family Medicine

## 2011-05-19 DIAGNOSIS — H8110 Benign paroxysmal vertigo, unspecified ear: Secondary | ICD-10-CM

## 2011-05-19 DIAGNOSIS — I10 Essential (primary) hypertension: Secondary | ICD-10-CM

## 2011-05-19 DIAGNOSIS — E039 Hypothyroidism, unspecified: Secondary | ICD-10-CM

## 2011-05-19 MED ORDER — LEVOTHYROXINE SODIUM 125 MCG PO TABS: 125.0000 ug | ORAL_TABLET | Freq: Every day | ORAL | Status: DC

## 2011-05-19 MED ORDER — TRIAMTERENE-HCTZ 37.5-25 MG PO TABS: 1.0000 | ORAL_TABLET | Freq: Every day | ORAL | Status: DC

## 2011-05-19 NOTE — Assessment & Plan Note (Signed)
TSH WNL in March 2012. No s/s of hyper or hypothyroidism.  Will recheck TSH when pt returns for well woman exam. Refilled synthroid.

## 2011-05-19 NOTE — Progress Notes (Signed)
S: Pt comes in today for follow up for vertigo.  Per pt, she has been symptom free x 1 week.  Has not used any meclizine in a few days. Would like to return to work on Monday.  [facility] which she plans to keep so that she has exercises she can do in the event that this happens again. No N/V, no headaches, no CP/SOB. Feels great.  Has no problems with movement or changing positions.    ROS: Per HPI  History  Smoking status  . Never Smoker   Smokeless tobacco  . Never Used    O:  Filed Vitals:   05/19/11 1503  BP: 117/80  Pulse: 91    Gen: NAD, no dizziness w/ head movements CV: RRR, no murmur Pulm: CTA bilat, no wheezes or crackles Abd: soft, NT Ext: Warm, no chronic skin changes, no edema   A/P: 50 y.o. female p/w resolved vertigo -See problem list -f/u in 2 months for well woman/BP check

## 2011-05-19 NOTE — Assessment & Plan Note (Signed)
Per pt, symptoms have resolved.  Gave note that she may return to work w/o restrictions on Tuesday so taht she is able to keep PT appt on Monday for rehab.

## 2011-05-19 NOTE — Assessment & Plan Note (Signed)
BP at goal today, refilled medications. Pt is to f/u for well-woman exam in the next few months. No CP/SOB or other issues. No hypotensive events.

## 2011-05-19 NOTE — Patient Instructions (Addendum)
It was nice to meet you! I'm so glad that your vertigo is better! I am giving you a note to go back to work on Tuesday so that you are able to keep [facility].  I am also refilling your blood pressure and thyroid medicines. Make sure you get your flu shot this season!! Please come back and see me in the next few months for a general well woman visit so we can discuss your blood pressure and other medical problems.

## 2011-05-23 ENCOUNTER — Ambulatory Visit: Payer: BC Managed Care – PPO | Admitting: Rehabilitative and Restorative Service Providers"

## 2011-05-23 ENCOUNTER — Ambulatory Visit: Payer: BC Managed Care – PPO | Attending: Family Medicine | Admitting: Rehabilitative and Restorative Service Providers"

## 2011-05-23 DIAGNOSIS — IMO0001 Reserved for inherently not codable concepts without codable children: Secondary | ICD-10-CM | POA: Insufficient documentation

## 2011-05-23 DIAGNOSIS — R42 Dizziness and giddiness: Secondary | ICD-10-CM | POA: Insufficient documentation

## 2011-08-10 ENCOUNTER — Other Ambulatory Visit: Payer: Self-pay | Admitting: Family Medicine

## 2011-08-10 DIAGNOSIS — Z1231 Encounter for screening mammogram for malignant neoplasm of breast: Secondary | ICD-10-CM

## 2011-08-24 ENCOUNTER — Ambulatory Visit: Payer: BC Managed Care – PPO

## 2011-09-09 ENCOUNTER — Ambulatory Visit: Payer: BC Managed Care – PPO

## 2011-09-19 ENCOUNTER — Ambulatory Visit (INDEPENDENT_AMBULATORY_CARE_PROVIDER_SITE_OTHER): Payer: BC Managed Care – PPO | Admitting: Family Medicine

## 2011-09-19 ENCOUNTER — Encounter: Payer: Self-pay | Admitting: Family Medicine

## 2011-09-19 DIAGNOSIS — R21 Rash and other nonspecific skin eruption: Secondary | ICD-10-CM | POA: Insufficient documentation

## 2011-09-19 HISTORY — DX: Rash and other nonspecific skin eruption: R21

## 2011-09-19 NOTE — Assessment & Plan Note (Signed)
I'm not sure the etiology of this rash however she has no red flag signs or symptoms for serous rash such as systemic involvement or mucosal involvement. Likely this is dry skin,  plan for hydrocortisone and moisturizers and return to clinic if not improving. She expresses understanding.

## 2011-09-19 NOTE — Progress Notes (Signed)
Courtney Collins is a 51 y.o. female who presents to [facility] today for rash on legs bilaterally. Developed 2 days ago with mild itchiness which is now resolved. She feels well otherwise with no fevers chills abdominal pain or mucosal involvement. She has not tried anything for the rash yet.   PMH reviewed.  ROS as above otherwise neg Medications reviewed. Current Outpatient Prescriptions  Medication Sig Dispense Refill  . levothyroxine (SYNTHROID, LEVOTHROID) 125 MCG tablet Take 1 tablet (125 mcg total) by mouth daily.  30 tablet  5  . triamterene-hydrochlorothiazide (MAXZIDE-25) 37.5-25 MG per tablet Take 1 each (1 tablet total) by mouth daily.  30 tablet  5    Exam:  BP 124/89  Pulse 90  Ht 5\' 2"  (1.575 m)  Wt 220 lb (99.791 kg)  BMI 40.24 kg/m2 Gen: Well NAD HEENT: No mucosal involvement Lungs: CTABL Nl WOB Heart: RRR no MRG Skin: Faint lacy macular rash on bilateral shins and calves. Blanchable not tender or puritic

## 2011-09-19 NOTE — Patient Instructions (Signed)
Thank you for coming in today. I think your rash is due to dry skin.  Apply hydrocortisone and moisturizers as needed.  If it is worsening , or associated with feeling bad or sores in your mouth, anus, or vagina come back. It should get better in a few days.

## 2011-09-27 ENCOUNTER — Ambulatory Visit
Admission: RE | Admit: 2011-09-27 | Discharge: 2011-09-27 | Disposition: A | Discharge status: Home / Self Care | Payer: BC Managed Care – PPO | Source: Ambulatory Visit | Attending: Internal Medicine | Admitting: Internal Medicine

## 2011-09-27 DIAGNOSIS — Z1231 Encounter for screening mammogram for malignant neoplasm of breast: Secondary | ICD-10-CM

## 2011-11-24 ENCOUNTER — Other Ambulatory Visit: Payer: Self-pay | Admitting: Family Medicine

## 2011-11-24 DIAGNOSIS — I10 Essential (primary) hypertension: Secondary | ICD-10-CM

## 2011-11-24 DIAGNOSIS — E039 Hypothyroidism, unspecified: Secondary | ICD-10-CM

## 2011-11-24 MED ORDER — TRIAMTERENE-HCTZ 37.5-25 MG PO TABS: 1.0000 | ORAL_TABLET | Freq: Every day | ORAL | Status: DC

## 2011-11-24 MED ORDER — LEVOTHYROXINE SODIUM 125 MCG PO TABS: 125.0000 ug | ORAL_TABLET | Freq: Every day | ORAL | Status: DC

## 2011-11-24 NOTE — Telephone Encounter (Signed)
Patient needs appt for f/u for any additional refills.

## 2011-12-14 ENCOUNTER — Ambulatory Visit (INDEPENDENT_AMBULATORY_CARE_PROVIDER_SITE_OTHER): Payer: BC Managed Care – PPO | Admitting: Family Medicine

## 2011-12-14 ENCOUNTER — Ambulatory Visit
Admission: RE | Admit: 2011-12-14 | Discharge: 2011-12-14 | Disposition: A | Discharge status: Home / Self Care | Payer: BC Managed Care – PPO | Source: Ambulatory Visit | Attending: Family Medicine | Admitting: Family Medicine

## 2011-12-14 ENCOUNTER — Encounter: Payer: Self-pay | Admitting: Family Medicine

## 2011-12-14 VITALS — BP 117/83 | HR 84 | Ht 63.0 in | Wt 216.0 lb

## 2011-12-14 DIAGNOSIS — I10 Essential (primary) hypertension: Secondary | ICD-10-CM

## 2011-12-14 DIAGNOSIS — R609 Edema, unspecified: Secondary | ICD-10-CM

## 2011-12-14 HISTORY — DX: Edema, unspecified: R60.9

## 2011-12-14 LAB — CBC
MCHC: 33.2 g/dL (ref 30.0–36.0)
Platelets: 406 10*3/uL — ABNORMAL HIGH (ref 150–400)
RDW: 13.7 % (ref 11.5–15.5)

## 2011-12-14 NOTE — Assessment & Plan Note (Signed)
BPs well-controlled. Checking potassium and creatinine. Continue on current regimen.

## 2011-12-14 NOTE — Progress Notes (Signed)
  Subjective:    Patient ID: Courtney Collins, female    DOB: 27-Aug-1960, 51 y.o.   MRN: [id number]  HPI Patient presents today with chief complaint of edema. Patient states that she's noticed diffuse swelling for the last month. Patient states she's waking up on most mornings with feeling of high puffiness as well as hand and feet swelling. Patient has a baseline history of hypertension is taking Maxzide for this. Patient states she's been on this dose of Maxide for many years. Patient states that she's been compliant with taking this medication. Patient states that she's also prophylactically cut back on the amount of salt she's been eating. Patient denies any can food or processed meat intake. Patient also has a baseline history of hypothyroidism that she's been on stable dose of Synthroid at 125 mcg daily. Patient states she's been compliant with medication and has not missed a dose. Patient denies any orthopnea or subjective dyspnea on exertion. Patient does not sleep with pillows. Patient denies any chest pain or shortness of breath. No history of heart disease. Patient states she did have some mild breast soreness that she had mammogram performed that was otherwise negative. Patient denies any NSAID use. Patient does report a high amount of water intake.   Review of Systems See HPI, otherwise ROS negative     Objective:   Physical Exam Gen: up in chair, NAD HEENT: NCAT, EOMI, TMs clear bilaterally CV: RRR, no murmurs auscultated, trace JVD, minimal HJR PULM: CTAB, no wheezes, rales, rhoncii ABD: S/NT/+ bowel sounds  EXT: 2+ peripheral pulses, minimal-trace LE edema Assessment & Plan:

## 2011-12-14 NOTE — Patient Instructions (Signed)
Edema Edema is an abnormal build-up of fluids in tissues. Because this is partly dependent on gravity (water flows to the lowest place), it is more common in the leg sand thighs (lower extremities). It is also common in the looser tissues, like around the eyes. Painless swelling of the feet and ankles is common and increases as a person ages. It may affect both legs and may include the calves or even thighs. When squeezed, the fluid may move out of the affected area and may leave a dent for a few moments. CAUSES   Prolonged standing or sitting in one place for extended periods of time. Movement helps pump tissue fluid into the veins, and absence of movement prevents this, resulting in edema.   Varicose veins. The valves in the veins do not work as well as they should. This causes fluid to leak into the tissues.   Fluid and salt overload.   Injury, burn, or surgery to the leg, ankle, or foot, may damage veins and allow fluid to leak out.   Sunburn damages vessels. Leaky vessels allow fluid to go out into the sunburned tissues.   Allergies (from insect bites or stings, medications or chemicals) cause swelling by allowing vessels to become leaky.   Protein in the blood helps keep fluid in your vessels. Low protein, as in malnutrition, allows fluid to leak out.   Hormonal changes, including pregnancy and menstruation, cause fluid retention. This fluid may leak out of vessels and cause edema.   Medications that cause fluid retention. Examples are sex hormones, blood pressure medications, steroid treatment, or anti-depressants.   Some illnesses cause edema, especially heart failure, kidney disease, or liver disease.   Surgery that cuts veins or lymph nodes, such as surgery done for the heart or for breast cancer, may result in edema.  DIAGNOSIS  Your caregiver is usually easily able to determine what is causing your swelling (edema) by simply asking what is wrong (getting a history) and examining  you (doing a physical). Sometimes x-rays, EKG (electrocardiogram or heart tracing), and blood work may be done to evaluate for underlying medical illness. TREATMENT  General treatment includes:  Leg elevation (or elevation of the affected body part).   Restriction of fluid intake.   Prevention of fluid overload.   Compression of the affected body part. Compression with elastic bandages or support stockings squeezes the tissues, preventing fluid from entering and forcing it back into the blood vessels.   Diuretics (also called water pills or fluid pills) pull fluid out of your body in the form of increased urination. These are effective in reducing the swelling, but can have side effects and must be used only under your caregiver's supervision. Diuretics are appropriate only for some types of edema.  The specific treatment can be directed at any underlying causes discovered. Heart, liver, or kidney disease should be treated appropriately. HOME CARE INSTRUCTIONS   Elevate the legs (or affected body part) above the level of the heart, while lying down.   Avoid sitting or standing still for prolonged periods of time.   Avoid putting anything directly under the knees when lying down, and do not wear constricting clothing or garters on the upper legs.   Exercising the legs causes the fluid to work back into the veins and lymphatic channels. This may help the swelling go down.   The pressure applied by elastic bandages or support stockings can help reduce ankle swelling.   A low-salt diet may help reduce fluid   retention and decrease the ankle swelling.   Take any medications exactly as prescribed.  SEEK MEDICAL CARE IF:  Your edema is not responding to recommended treatments. SEEK IMMEDIATE MEDICAL CARE IF:   You develop shortness of breath or chest pain.   You cannot breathe when you lay down; or if, while lying down, you have to get up and go to the window to get your breath.   You  are having increasing swelling without relief from treatment.   You develop a fever over 102 F (38.9 C).   You develop pain or redness in the areas that are swollen.   Tell your caregiver right away if you have gained 3 lb/1.4 kg in 1 day or 5 lb/2.3 kg in a week.  MAKE SURE YOU:   Understand these instructions.   Will watch your condition.   Will get help right away if you are not doing well or get worse.  Document Released: 08/01/2005 Document Revised: 07/21/2011 Document Reviewed: 03/19/2008 [employer] Patient Information 2012 [employer]. 

## 2011-12-14 NOTE — Assessment & Plan Note (Addendum)
Broad differential for this, though I suspect this may be secondary to high fluid intake. Discussed cutting back on water intake as well as sodium restriction. Will check labs including CBC, BNP, TSH. Will also check chest x-ray for cardiomegaly. Will make no medication changes pending workup. No cardiovascular compromise and exam which is reassuring. Handout given.

## 2011-12-15 ENCOUNTER — Encounter: Payer: Self-pay | Admitting: Family Medicine

## 2011-12-15 LAB — COMPREHENSIVE METABOLIC PANEL
ALT: 17 U/L (ref 0–35)
AST: 21 U/L (ref 0–37)
Albumin: 4.7 g/dL (ref 3.5–5.2)
Alkaline Phosphatase: 67 U/L (ref 39–117)
BUN: 17 mg/dL (ref 6–23)
Potassium: 4 mEq/L (ref 3.5–5.3)
Sodium: 139 mEq/L (ref 135–145)
Total Protein: 7.5 g/dL (ref 6.0–8.3)

## 2011-12-29 ENCOUNTER — Telehealth: Payer: Self-pay | Admitting: Family Medicine

## 2011-12-29 DIAGNOSIS — E039 Hypothyroidism, unspecified: Secondary | ICD-10-CM

## 2011-12-29 NOTE — Telephone Encounter (Signed)
Pt came in for labs and was told her synthroid needed to be adjusted but they didn't tell her how.  Wants to know if Dr Fara Boros could let her know without having to make another appt.

## 2011-12-30 NOTE — Telephone Encounter (Signed)
Patient had abnormal TSH on 12/14/11, wants to know if she needs to adjust her medication. Forward to PCP for instructions?

## 2011-12-31 MED ORDER — LEVOTHYROXINE SODIUM 150 MCG PO TABS: 150.0000 ug | ORAL_TABLET | Freq: Every day | ORAL | Status: DC

## 2011-12-31 NOTE — Telephone Encounter (Signed)
I have sent in an Rx for a slightly higher dose of the synthroid.  She will need to come back and have labs redrawn in 4-6 weeks.  I have also placed the order for future labs.

## 2012-01-02 NOTE — Telephone Encounter (Signed)
Left message for patient to return call, please read message from Dr Fara Boros.Courtney Collins, Courtney Collins

## 2012-01-04 NOTE — Telephone Encounter (Signed)
Left message again for patient to return call.Courtney Collins  

## 2012-01-05 NOTE — Telephone Encounter (Signed)
Attempted to call patient again, if she returns call please see message below.Dianna Deshler, [name]

## 2012-01-24 ENCOUNTER — Other Ambulatory Visit: Payer: BC Managed Care – PPO

## 2012-01-24 DIAGNOSIS — E039 Hypothyroidism, unspecified: Secondary | ICD-10-CM

## 2012-01-24 NOTE — Progress Notes (Signed)
TSH DONE TODAY Biana Haggar 

## 2012-01-25 ENCOUNTER — Telehealth: Payer: Self-pay | Admitting: Family Medicine

## 2012-01-25 LAB — TSH: TSH: 0.422 u[IU]/mL (ref 0.350–4.500)

## 2012-01-25 NOTE — Telephone Encounter (Signed)
Please call pt and let her know that her thyroid tests are now normal.  Continue new dose of Synthroid.  F/u with me in 1-2 months so we can see how she is doing. Thanks!

## 2012-01-25 NOTE — Telephone Encounter (Signed)
Left message for patient to return call. Please see message from Dr McGill.Courtney Collins  

## 2012-01-27 ENCOUNTER — Other Ambulatory Visit: Payer: Self-pay | Admitting: Family Medicine

## 2012-01-27 NOTE — Telephone Encounter (Signed)
Called and read message from Dr Fara Boros to patient.Courtney Collins, [name]

## 2012-02-06 ENCOUNTER — Other Ambulatory Visit: Payer: BC Managed Care – PPO

## 2012-02-29 ENCOUNTER — Other Ambulatory Visit: Payer: Self-pay | Admitting: Family Medicine

## 2012-04-13 ENCOUNTER — Other Ambulatory Visit: Payer: Self-pay | Admitting: Family Medicine

## 2012-04-17 NOTE — Telephone Encounter (Signed)
PATIENT NEEDS APPT WITH PCP FOR FURTHER REFILLS

## 2012-05-19 ENCOUNTER — Other Ambulatory Visit: Payer: Self-pay | Admitting: Family Medicine

## 2012-05-21 NOTE — Telephone Encounter (Signed)
Patient needs appt for further refills of any medications.

## 2012-06-26 ENCOUNTER — Other Ambulatory Visit: Payer: Self-pay | Admitting: Family Medicine

## 2012-06-26 NOTE — Telephone Encounter (Signed)
Patient needs appt for further refills.  Will not refill without appt being scheduled. Has been told MULTIPLE TIMES at this point.

## 2012-07-18 ENCOUNTER — Ambulatory Visit (INDEPENDENT_AMBULATORY_CARE_PROVIDER_SITE_OTHER): Payer: BC Managed Care – PPO | Admitting: Family Medicine

## 2012-07-18 ENCOUNTER — Encounter: Payer: Self-pay | Admitting: Family Medicine

## 2012-07-19 NOTE — Progress Notes (Signed)
Patient left before being seen, stating she had another appointment to get to.  Patient was 15 minutes late to her 4:15 appointment and left at 4:40.

## 2012-08-06 ENCOUNTER — Other Ambulatory Visit: Payer: Self-pay | Admitting: *Deleted

## 2012-08-24 ENCOUNTER — Ambulatory Visit: Payer: BC Managed Care – PPO | Admitting: Family Medicine

## 2012-12-17 ENCOUNTER — Other Ambulatory Visit: Payer: Self-pay

## 2012-12-17 DIAGNOSIS — Z1231 Encounter for screening mammogram for malignant neoplasm of breast: Secondary | ICD-10-CM

## 2013-01-23 ENCOUNTER — Ambulatory Visit
Admission: RE | Admit: 2013-01-23 | Discharge: 2013-01-23 | Disposition: A | Discharge status: Home / Self Care | Payer: BC Managed Care – PPO | Source: Ambulatory Visit

## 2013-01-23 DIAGNOSIS — Z1231 Encounter for screening mammogram for malignant neoplasm of breast: Secondary | ICD-10-CM

## 2013-02-06 ENCOUNTER — Other Ambulatory Visit: Payer: Self-pay | Admitting: Gastroenterology

## 2013-02-28 ENCOUNTER — Other Ambulatory Visit: Payer: Self-pay | Admitting: Gastroenterology

## 2013-02-28 DIAGNOSIS — Z8371 Family history of colonic polyps: Secondary | ICD-10-CM

## 2013-03-27 ENCOUNTER — Other Ambulatory Visit: Payer: BC Managed Care – PPO

## 2013-10-03 ENCOUNTER — Ambulatory Visit
Admission: RE | Admit: 2013-10-03 | Discharge: 2013-10-03 | Disposition: A | Discharge status: Home / Self Care | Payer: BC Managed Care – PPO | Source: Ambulatory Visit | Attending: Gastroenterology | Admitting: Gastroenterology

## 2013-10-03 DIAGNOSIS — Z8371 Family history of colonic polyps: Secondary | ICD-10-CM

## 2014-12-05 ENCOUNTER — Other Ambulatory Visit: Payer: Self-pay | Admitting: Family Medicine

## 2014-12-05 ENCOUNTER — Ambulatory Visit
Admission: RE | Admit: 2014-12-05 | Discharge: 2014-12-05 | Disposition: A | Discharge status: Home / Self Care | Payer: BLUE CROSS/BLUE SHIELD | Source: Ambulatory Visit | Attending: Family Medicine | Admitting: Family Medicine

## 2014-12-05 DIAGNOSIS — M25511 Pain in right shoulder: Secondary | ICD-10-CM

## 2014-12-05 DIAGNOSIS — M542 Cervicalgia: Secondary | ICD-10-CM

## 2016-02-19 ENCOUNTER — Other Ambulatory Visit: Payer: Self-pay | Admitting: Surgical

## 2016-02-19 ENCOUNTER — Encounter (HOSPITAL_COMMUNITY)
Admission: RE | Admit: 2016-02-19 | Discharge: 2016-02-19 | Disposition: A | Discharge status: Home / Self Care | Payer: Managed Care, Other (non HMO) | Source: Ambulatory Visit | Attending: Orthopedic Surgery | Admitting: Orthopedic Surgery

## 2016-02-19 ENCOUNTER — Encounter (HOSPITAL_COMMUNITY): Payer: Self-pay

## 2016-02-19 DIAGNOSIS — I498 Other specified cardiac arrhythmias: Secondary | ICD-10-CM | POA: Insufficient documentation

## 2016-02-19 DIAGNOSIS — I10 Essential (primary) hypertension: Secondary | ICD-10-CM | POA: Diagnosis not present

## 2016-02-19 DIAGNOSIS — Z01818 Encounter for other preprocedural examination: Secondary | ICD-10-CM | POA: Insufficient documentation

## 2016-02-19 DIAGNOSIS — Z01812 Encounter for preprocedural laboratory examination: Secondary | ICD-10-CM | POA: Diagnosis not present

## 2016-02-19 DIAGNOSIS — M75101 Unspecified rotator cuff tear or rupture of right shoulder, not specified as traumatic: Secondary | ICD-10-CM | POA: Diagnosis not present

## 2016-02-19 HISTORY — DX: Unspecified osteoarthritis, unspecified site: M19.90

## 2016-02-19 LAB — PROTIME-INR
INR: 0.9 (ref 0.00–1.49)
Prothrombin Time: 12.3 seconds (ref 11.6–15.2)

## 2016-02-19 LAB — CBC WITH DIFFERENTIAL/PLATELET
Basophils Absolute: 0 10*3/uL (ref 0.0–0.1)
Basophils Relative: 0 %
Eosinophils Absolute: 0.5 10*3/uL (ref 0.0–0.7)
Eosinophils Relative: 7 %
HCT: 38.7 % (ref 36.0–46.0)
Hemoglobin: 13.4 g/dL (ref 12.0–15.0)
Lymphocytes Relative: 22 %
Lymphs Abs: 1.5 10*3/uL (ref 0.7–4.0)
MCH: 28.7 pg (ref 26.0–34.0)
MCHC: 34.6 g/dL (ref 30.0–36.0)
MCV: 82.9 fL (ref 78.0–100.0)
Monocytes Absolute: 0.4 10*3/uL (ref 0.1–1.0)
Monocytes Relative: 7 %
Neutro Abs: 4.3 10*3/uL (ref 1.7–7.7)
Neutrophils Relative %: 64 %
Platelets: 330 10*3/uL (ref 150–400)
RBC: 4.67 MIL/uL (ref 3.87–5.11)
RDW: 13.1 % (ref 11.5–15.5)
WBC: 6.8 10*3/uL (ref 4.0–10.5)

## 2016-02-19 LAB — HCG, SERUM, QUALITATIVE: PREG SERUM: NEGATIVE

## 2016-02-19 LAB — COMPREHENSIVE METABOLIC PANEL
ALT: 21 U/L (ref 14–54)
AST: 26 U/L (ref 15–41)
Albumin: 4.6 g/dL (ref 3.5–5.0)
Alkaline Phosphatase: 66 U/L (ref 38–126)
Anion gap: 8 (ref 5–15)
BUN: 15 mg/dL (ref 6–20)
CO2: 27 mmol/L (ref 22–32)
Calcium: 9.1 mg/dL (ref 8.9–10.3)
Chloride: 103 mmol/L (ref 101–111)
Creatinine, Ser: 0.64 mg/dL (ref 0.44–1.00)
GFR calc Af Amer: >60 mL/min (ref >60)
GFR calc non Af Amer: >60 mL/min (ref >60)
Glucose, Bld: 86 mg/dL (ref 65–99)
Potassium: 3.4 mmol/L — ABNORMAL LOW (ref 3.5–5.1)
Sodium: 138 mmol/L (ref 135–145)
Total Bilirubin: 0.7 mg/dL (ref 0.3–1.2)
Total Protein: 8.1 g/dL (ref 6.5–8.1)

## 2016-02-19 LAB — APTT: aPTT: 31 seconds (ref 24–37)

## 2016-02-19 NOTE — Patient Instructions (Addendum)
Courtney Collins  02/19/2016   Your procedure is scheduled on: Tuesday 02/23/16  Report to [facility] Main  Entrance take [facility]  elevators to 3rd floor to  [place] at 1:30 PM  Call this number if you have problems the morning of surgery [phone]   Remember: ONLY 1 PERSON MAY GO WITH YOU TO SHORT STAY TO GET  READY MORNING OF YOUR SURGERY.  Do not eat food :After Midnight.--MAY HAVE CLEAR LIQUIDS UNTIL 9:30 am-THEN NOTHING BY MOUTH     Take these medicines the morning of surgery with A SIP OF WATER: Levothyroxine                                You may not have any metal on your body including hair pins and              piercings  Do not wear jewelry, make-up, lotions, powders or perfumes, deodorant             Do not wear nail polish.  Do not shave  48 hours prior to surgery.                 Do not bring valuables to the hospital. [name].  Contacts, dentures or bridgework may not be worn into surgery.  Leave suitcase in the car. After surgery it may be brought to your room.    _____________________________________________________________________             [facility] - Preparing for Surgery Before surgery, you can play an important role.  Because skin is not sterile, your skin needs to be as free of germs as possible.  You can reduce the number of germs on your skin by washing with CHG (chlorahexidine gluconate) soap before surgery.  CHG is an antiseptic cleaner which kills germs and bonds with the skin to continue killing germs even after washing. Please DO NOT use if you have an allergy to CHG or antibacterial soaps.  If your skin becomes reddened/irritated stop using the CHG and inform your nurse when you arrive at Short Stay. Do not shave (including legs and underarms) for at least 48 hours prior to the first CHG shower.  You may shave your face/neck. Please follow these instructions  carefully:  1.  Shower with CHG Soap the night before surgery and the  morning of Surgery.  2.  If you choose to wash your hair, wash your hair first as usual with your  normal  shampoo.  3.  After you shampoo, rinse your hair and body thoroughly to remove the  shampoo.                           4.  Use CHG as you would any other liquid soap.  You can apply chg directly  to the skin and wash                       Gently with a scrungie or clean washcloth.  5.  Apply the CHG Soap to your body ONLY FROM THE NECK DOWN.   Do not use on face/ open  Wound or open sores. Avoid contact with eyes, ears mouth and genitals (private parts).                       Wash face,  Genitals (private parts) with your normal soap.             6.  Wash thoroughly, paying special attention to the area where your surgery  will be performed.  7.  Thoroughly rinse your body with warm water from the neck down.  8.  DO NOT shower/wash with your normal soap after using and rinsing off  the CHG Soap.                9.  Pat yourself dry with a clean towel.            10.  Wear clean pajamas.            11.  Place clean sheets on your bed the night of your first shower and do not  sleep with pets. Day of Surgery : Do not apply any lotions/deodorants the morning of surgery.  Please wear clean clothes to the hospital/surgery center.  FAILURE TO FOLLOW THESE INSTRUCTIONS MAY RESULT IN THE CANCELLATION OF YOUR SURGERY PATIENT SIGNATURE_________________________________  NURSE SIGNATURE__________________________________  ________________________________________________________________________    CLEAR LIQUID DIET   Foods Allowed                                                                     Foods Excluded  Coffee and tea, regular and decaf                             liquids that you cannot  Plain Jell-O in any flavor                                             see through such as: Fruit ices  (not with fruit pulp)                                     milk, soups, orange juice  Iced Popsicles                                    All solid food Carbonated beverages, regular and diet                                    Cranberry, grape and apple juices Sports drinks like Gatorade Lightly seasoned clear broth or consume(fat free) Sugar, honey syrup  Sample Menu Breakfast                                Lunch  Supper Cranberry juice                    Beef broth                            Chicken broth Jell-O                                     Grape juice                           Apple juice Coffee or tea                        Jell-O                                      Popsicle                                                Coffee or tea                        Coffee or tea  _____________________________________________________________________    Incentive Spirometer  An incentive spirometer is a tool that can help keep your lungs clear and active. This tool measures how well you are filling your lungs with each breath. Taking long deep breaths may help reverse or decrease the chance of developing breathing (pulmonary) problems (especially infection) following:  A long period of time when you are unable to move or be active. BEFORE THE PROCEDURE   If the spirometer includes an indicator to show your best effort, your nurse or respiratory therapist will set it to a desired goal.  If possible, sit up straight or lean slightly forward. Try not to slouch.  Hold the incentive spirometer in an upright position. INSTRUCTIONS FOR USE  1. Sit on the edge of your bed if possible, or sit up as far as you can in bed or on a chair. 2. Hold the incentive spirometer in an upright position. 3. Breathe out normally. 4. Place the mouthpiece in your mouth and seal your lips tightly around it. 5. Breathe in slowly and as deeply as possible, raising the piston  or the ball toward the top of the column. 6. Hold your breath for 3-5 seconds or for as long as possible. Allow the piston or ball to fall to the bottom of the column. 7. Remove the mouthpiece from your mouth and breathe out normally. 8. Rest for a few seconds and repeat Steps 1 through 7 at least 10 times every 1-2 hours when you are awake. Take your time and take a few normal breaths between deep breaths. 9. The spirometer may include an indicator to show your best effort. Use the indicator as a goal to work toward during each repetition. 10. After each set of 10 deep breaths, practice coughing to be sure your lungs are clear. If you have an incision (the cut made at the time of surgery), support your incision when coughing by placing a pillow or rolled up towels firmly against  it. Once you are able to get out of bed, walk around indoors and cough well. You may stop using the incentive spirometer when instructed by your caregiver.  RISKS AND COMPLICATIONS  Take your time so you do not get dizzy or light-headed.  If you are in pain, you may need to take or ask for pain medication before doing incentive spirometry. It is harder to take a deep breath if you are having pain. AFTER USE  Rest and breathe slowly and easily.  It can be helpful to keep track of a log of your progress. Your caregiver can provide you with a simple table to help with this. If you are using the spirometer at home, follow these instructions: [place] IF:   You are having difficultly using the spirometer.  You have trouble using the spirometer as often as instructed.  Your pain medication is not giving enough relief while using the spirometer.  You develop fever of 100.5 F (38.1 C) or higher. SEEK IMMEDIATE MEDICAL CARE IF:   You cough up bloody sputum that had not been present before.  You develop fever of 102 F (38.9 C) or greater.  You develop worsening pain at or near the incision site. MAKE  SURE YOU:   Understand these instructions.  Will watch your condition.  Will get help right away if you are not doing well or get worse. Document Released: 12/12/2006 Document Revised: 10/24/2011 Document Reviewed: 02/12/2007 [facility] Patient Information 2014 [place], [place].   ________________________________________________________________________

## 2016-02-23 ENCOUNTER — Ambulatory Visit (HOSPITAL_COMMUNITY)
Admission: RE | Admit: 2016-02-23 | Payer: Managed Care, Other (non HMO) | Source: Ambulatory Visit | Admitting: Orthopedic Surgery

## 2016-02-23 ENCOUNTER — Encounter (HOSPITAL_COMMUNITY): Admission: RE | Payer: Self-pay | Source: Ambulatory Visit

## 2016-02-23 SURGERY — REPAIR, ROTATOR CUFF, OPEN
Anesthesia: General | Laterality: Right

## 2016-03-03 ENCOUNTER — Encounter (HOSPITAL_COMMUNITY): Payer: Self-pay | Admitting: *Deleted

## 2016-03-04 ENCOUNTER — Other Ambulatory Visit: Payer: Self-pay | Admitting: Surgical

## 2016-03-08 ENCOUNTER — Encounter (HOSPITAL_COMMUNITY): Payer: Self-pay | Admitting: *Deleted

## 2016-03-09 ENCOUNTER — Encounter (HOSPITAL_COMMUNITY): Payer: Self-pay | Admitting: *Deleted

## 2016-03-09 ENCOUNTER — Encounter (HOSPITAL_COMMUNITY)
Admission: RE | Disposition: A | Discharge status: Home / Self Care | Payer: Self-pay | Source: Ambulatory Visit | Attending: Orthopedic Surgery

## 2016-03-09 ENCOUNTER — Observation Stay (HOSPITAL_COMMUNITY)
Admission: RE | Admit: 2016-03-09 | Discharge: 2016-03-10 | Disposition: A | Discharge status: Home / Self Care | Payer: Managed Care, Other (non HMO) | Source: Ambulatory Visit | Attending: Orthopedic Surgery | Admitting: Orthopedic Surgery

## 2016-03-09 ENCOUNTER — Ambulatory Visit (HOSPITAL_COMMUNITY): Payer: Managed Care, Other (non HMO) | Admitting: Anesthesiology

## 2016-03-09 DIAGNOSIS — M75121 Complete rotator cuff tear or rupture of right shoulder, not specified as traumatic: Secondary | ICD-10-CM | POA: Insufficient documentation

## 2016-03-09 DIAGNOSIS — Z6841 Body Mass Index (BMI) 40.0 and over, adult: Secondary | ICD-10-CM | POA: Insufficient documentation

## 2016-03-09 DIAGNOSIS — M7541 Impingement syndrome of right shoulder: Secondary | ICD-10-CM | POA: Diagnosis not present

## 2016-03-09 DIAGNOSIS — M1990 Unspecified osteoarthritis, unspecified site: Secondary | ICD-10-CM | POA: Diagnosis not present

## 2016-03-09 DIAGNOSIS — M75120 Complete rotator cuff tear or rupture of unspecified shoulder, not specified as traumatic: Secondary | ICD-10-CM | POA: Diagnosis present

## 2016-03-09 DIAGNOSIS — Z79899 Other long term (current) drug therapy: Secondary | ICD-10-CM | POA: Insufficient documentation

## 2016-03-09 DIAGNOSIS — E669 Obesity, unspecified: Secondary | ICD-10-CM | POA: Diagnosis not present

## 2016-03-09 DIAGNOSIS — I10 Essential (primary) hypertension: Secondary | ICD-10-CM | POA: Insufficient documentation

## 2016-03-09 DIAGNOSIS — E039 Hypothyroidism, unspecified: Secondary | ICD-10-CM | POA: Insufficient documentation

## 2016-03-09 DIAGNOSIS — M25511 Pain in right shoulder: Secondary | ICD-10-CM | POA: Diagnosis present

## 2016-03-09 HISTORY — PX: SHOULDER OPEN ROTATOR CUFF REPAIR: SHX2407

## 2016-03-09 LAB — TYPE AND SCREEN
ABO/RH(D): O POS
ANTIBODY SCREEN: NEGATIVE

## 2016-03-09 LAB — ABO/RH: ABO/RH(D): O POS

## 2016-03-09 LAB — HCG, SERUM, QUALITATIVE: Preg, Serum: NEGATIVE

## 2016-03-09 SURGERY — REPAIR, ROTATOR CUFF, OPEN
Anesthesia: General | Site: Shoulder | Laterality: Right

## 2016-03-09 MED ORDER — PHENYLEPHRINE HCL 10 MG/ML IJ SOLN
INTRAVENOUS | Status: DC | PRN
  Administered 2016-03-09: 25 ug/min via INTRAVENOUS

## 2016-03-09 MED ORDER — CEFAZOLIN IN D5W 1 GM/50ML IV SOLN
1.0000 g | Freq: Four times a day (QID) | INTRAVENOUS | Status: AC
  Administered 2016-03-09 – 2016-03-10 (×3): 1 g via INTRAVENOUS
  Filled 2016-03-09 (×3): qty 50

## 2016-03-09 MED ORDER — ONDANSETRON HCL 4 MG/2ML IJ SOLN
INTRAMUSCULAR | Status: DC | PRN
  Administered 2016-03-09: 4 mg via INTRAVENOUS

## 2016-03-09 MED ORDER — SODIUM CHLORIDE 0.9 % IJ SOLN
INTRAMUSCULAR | Status: AC
  Filled 2016-03-09: qty 50

## 2016-03-09 MED ORDER — SUCCINYLCHOLINE CHLORIDE 20 MG/ML IJ SOLN
INTRAMUSCULAR | Status: DC | PRN
  Administered 2016-03-09: 100 mg via INTRAVENOUS

## 2016-03-09 MED ORDER — ONDANSETRON HCL 4 MG PO TABS: 4.0000 mg | ORAL_TABLET | Freq: Four times a day (QID) | ORAL | Status: DC | PRN

## 2016-03-09 MED ORDER — BUPIVACAINE-EPINEPHRINE (PF) 0.5% -1:200000 IJ SOLN
INTRAMUSCULAR | Status: AC
  Filled 2016-03-09: qty 30

## 2016-03-09 MED ORDER — DEXAMETHASONE SODIUM PHOSPHATE 10 MG/ML IJ SOLN
INTRAMUSCULAR | Status: AC
Start: 2016-03-09 — End: 2016-03-09
  Filled 2016-03-09: qty 1

## 2016-03-09 MED ORDER — FENTANYL CITRATE (PF) 100 MCG/2ML IJ SOLN
INTRAMUSCULAR | Status: DC | PRN
  Administered 2016-03-09: 50 ug via INTRAVENOUS
  Administered 2016-03-09: 100 ug via INTRAVENOUS

## 2016-03-09 MED ORDER — PHENOL 1.4 % MT LIQD: 1.0000 | OROMUCOSAL | Status: DC | PRN

## 2016-03-09 MED ORDER — ONDANSETRON HCL 4 MG/2ML IJ SOLN
INTRAMUSCULAR | Status: AC
  Filled 2016-03-09: qty 2

## 2016-03-09 MED ORDER — ACETAMINOPHEN 650 MG RE SUPP: 650.0000 mg | Freq: Four times a day (QID) | RECTAL | Status: DC | PRN

## 2016-03-09 MED ORDER — LIDOCAINE HCL (CARDIAC) 20 MG/ML IV SOLN
INTRAVENOUS | Status: DC | PRN
  Administered 2016-03-09: 100 mg via INTRAVENOUS

## 2016-03-09 MED ORDER — CEFAZOLIN SODIUM-DEXTROSE 2-4 GM/100ML-% IV SOLN
INTRAVENOUS | Status: AC
  Filled 2016-03-09: qty 100

## 2016-03-09 MED ORDER — TRIAMTERENE-HCTZ 37.5-25 MG PO TABS
1.0000 | ORAL_TABLET | Freq: Every day | ORAL | Status: DC
  Administered 2016-03-09 – 2016-03-10 (×2): 1 via ORAL
  Filled 2016-03-09 (×2): qty 1

## 2016-03-09 MED ORDER — OXYCODONE-ACETAMINOPHEN 5-325 MG PO TABS: 1.0000 | ORAL_TABLET | ORAL | Status: DC | PRN

## 2016-03-09 MED ORDER — LACTATED RINGERS IV SOLN
INTRAVENOUS | Status: DC
  Administered 2016-03-09: 100 mL/h via INTRAVENOUS

## 2016-03-09 MED ORDER — MENTHOL 3 MG MT LOZG
1.0000 | LOZENGE | OROMUCOSAL | Status: DC | PRN
Start: 2016-03-09 — End: 2016-03-10

## 2016-03-09 MED ORDER — SODIUM CHLORIDE 0.9 % IR SOLN
Status: AC
  Filled 2016-03-09: qty 1

## 2016-03-09 MED ORDER — PROPOFOL 10 MG/ML IV BOLUS
INTRAVENOUS | Status: AC
  Filled 2016-03-09: qty 20

## 2016-03-09 MED ORDER — HYDROMORPHONE HCL 1 MG/ML IJ SOLN: 1.0000 mg | INTRAMUSCULAR | Status: DC | PRN

## 2016-03-09 MED ORDER — SODIUM CHLORIDE 0.9 % IR SOLN
Status: DC | PRN
  Administered 2016-03-09: 500 mL

## 2016-03-09 MED ORDER — BUPIVACAINE LIPOSOME 1.3 % IJ SUSP
20.0000 mL | Freq: Once | INTRAMUSCULAR | Status: DC
  Filled 2016-03-09: qty 20

## 2016-03-09 MED ORDER — ASPIRIN EC 325 MG PO TBEC
325.0000 mg | DELAYED_RELEASE_TABLET | Freq: Every day | ORAL | Status: DC
  Administered 2016-03-09 – 2016-03-10 (×2): 325 mg via ORAL
  Filled 2016-03-09 (×2): qty 1

## 2016-03-09 MED ORDER — HYDROCODONE-ACETAMINOPHEN 5-325 MG PO TABS
1.0000 | ORAL_TABLET | ORAL | Status: DC | PRN
  Administered 2016-03-09: 1 via ORAL
  Administered 2016-03-09 – 2016-03-10 (×2): 2 via ORAL
  Filled 2016-03-09: qty 2
  Filled 2016-03-09: qty 1
  Filled 2016-03-09: qty 2

## 2016-03-09 MED ORDER — MIDAZOLAM HCL 5 MG/5ML IJ SOLN
INTRAMUSCULAR | Status: DC | PRN
  Administered 2016-03-09: 2 mg via INTRAVENOUS

## 2016-03-09 MED ORDER — PHENYLEPHRINE HCL 10 MG/ML IJ SOLN
INTRAMUSCULAR | Status: AC
  Filled 2016-03-09: qty 1

## 2016-03-09 MED ORDER — DEXAMETHASONE SODIUM PHOSPHATE 10 MG/ML IJ SOLN
INTRAMUSCULAR | Status: DC | PRN
  Administered 2016-03-09: 10 mg via INTRAVENOUS

## 2016-03-09 MED ORDER — FENTANYL CITRATE (PF) 100 MCG/2ML IJ SOLN
INTRAMUSCULAR | Status: AC
  Filled 2016-03-09: qty 2

## 2016-03-09 MED ORDER — LACTATED RINGERS IV SOLN
INTRAVENOUS | Status: DC | PRN
  Administered 2016-03-09: 10:00:00 via INTRAVENOUS

## 2016-03-09 MED ORDER — ACETAMINOPHEN 325 MG PO TABS: 650.0000 mg | ORAL_TABLET | Freq: Four times a day (QID) | ORAL | Status: DC | PRN

## 2016-03-09 MED ORDER — SUGAMMADEX SODIUM 200 MG/2ML IV SOLN
INTRAVENOUS | Status: AC
  Filled 2016-03-09: qty 2

## 2016-03-09 MED ORDER — METOCLOPRAMIDE HCL 5 MG PO TABS: 5.0000 mg | ORAL_TABLET | Freq: Three times a day (TID) | ORAL | Status: DC | PRN

## 2016-03-09 MED ORDER — METOCLOPRAMIDE HCL 5 MG/ML IJ SOLN
5.0000 mg | Freq: Three times a day (TID) | INTRAMUSCULAR | Status: DC | PRN
Start: 2016-03-09 — End: 2016-03-10

## 2016-03-09 MED ORDER — LIDOCAINE HCL (CARDIAC) 20 MG/ML IV SOLN
INTRAVENOUS | Status: AC
  Filled 2016-03-09: qty 5

## 2016-03-09 MED ORDER — PROPOFOL 10 MG/ML IV BOLUS
INTRAVENOUS | Status: DC | PRN
  Administered 2016-03-09: 200 mg via INTRAVENOUS

## 2016-03-09 MED ORDER — LEVOTHYROXINE SODIUM 75 MCG PO TABS
150.0000 ug | ORAL_TABLET | Freq: Every day | ORAL | Status: DC
  Administered 2016-03-10: 150 ug via ORAL
  Filled 2016-03-09: qty 2

## 2016-03-09 MED ORDER — ROCURONIUM BROMIDE 100 MG/10ML IV SOLN
INTRAVENOUS | Status: AC
  Filled 2016-03-09: qty 1

## 2016-03-09 MED ORDER — ONDANSETRON HCL 4 MG/2ML IJ SOLN: 4.0000 mg | Freq: Four times a day (QID) | INTRAMUSCULAR | Status: DC | PRN

## 2016-03-09 MED ORDER — PHENYLEPHRINE HCL 10 MG/ML IJ SOLN
INTRAMUSCULAR | Status: DC | PRN
  Administered 2016-03-09 (×5): 80 ug via INTRAVENOUS

## 2016-03-09 MED ORDER — PROMETHAZINE HCL 25 MG/ML IJ SOLN: 6.2500 mg | INTRAMUSCULAR | Status: DC | PRN

## 2016-03-09 MED ORDER — LACTATED RINGERS IV SOLN: INTRAVENOUS | Status: DC

## 2016-03-09 MED ORDER — CEFAZOLIN SODIUM-DEXTROSE 2-4 GM/100ML-% IV SOLN
2.0000 g | INTRAVENOUS | Status: AC
  Administered 2016-03-09: 2 g via INTRAVENOUS

## 2016-03-09 MED ORDER — PHENYLEPHRINE 40 MCG/ML (10ML) SYRINGE FOR IV PUSH (FOR BLOOD PRESSURE SUPPORT)
PREFILLED_SYRINGE | INTRAVENOUS | Status: AC
  Filled 2016-03-09: qty 10

## 2016-03-09 MED ORDER — MIDAZOLAM HCL 2 MG/2ML IJ SOLN
INTRAMUSCULAR | Status: AC
  Filled 2016-03-09: qty 2

## 2016-03-09 MED ORDER — HYDROMORPHONE HCL 1 MG/ML IJ SOLN: 0.2500 mg | INTRAMUSCULAR | Status: DC | PRN

## 2016-03-09 MED ORDER — SUGAMMADEX SODIUM 200 MG/2ML IV SOLN
INTRAVENOUS | Status: DC | PRN
  Administered 2016-03-09: 200 mg via INTRAVENOUS

## 2016-03-09 MED ORDER — ROCURONIUM BROMIDE 100 MG/10ML IV SOLN
INTRAVENOUS | Status: DC | PRN
  Administered 2016-03-09: 30 mg via INTRAVENOUS

## 2016-03-09 MED ORDER — THROMBIN 5000 UNITS EX SOLR
CUTANEOUS | Status: AC
  Filled 2016-03-09: qty 5000

## 2016-03-09 SURGICAL SUPPLY — 51 items
AGENT HMST SPONGE THK3/8 (HEMOSTASIS) ×1
ANCH SUT 2 5.5 BABSR ASCP (Orthopedic Implant) ×1 IMPLANT
ANCHOR PEEK ZIP 5.5 NDL NO2 (Orthopedic Implant) ×1 IMPLANT
ATTRACTOMAT 16X20 MAGNETIC DRP (DRAPES) ×2 IMPLANT
BAG SPEC THK2 15X12 ZIP CLS (MISCELLANEOUS)
BAG ZIPLOCK 12X15 (MISCELLANEOUS) IMPLANT
BLADE OSCILLATING/SAGITTAL (BLADE) ×2
BLADE SW THK.38XMED LNG THN (BLADE) ×1 IMPLANT
BNDG COHESIVE 6X5 TAN STRL LF (GAUZE/BANDAGES/DRESSINGS) ×2 IMPLANT
BUR OVAL CARBIDE 4.0 (BURR) ×2 IMPLANT
DERMASPAN .5-.9MM 4X4CM SHOU (Miscellaneous) ×1 IMPLANT
DRAPE POUCH INSTRU U-SHP 10X18 (DRAPES) ×2 IMPLANT
DRSG AQUACEL AG ADV 3.5X 6 (GAUZE/BANDAGES/DRESSINGS) ×2 IMPLANT
DURAPREP 26ML APPLICATOR (WOUND CARE) ×2 IMPLANT
ELECT BLADE TIP CTD 4 INCH (ELECTRODE) IMPLANT
ELECT REM PT RETURN 9FT ADLT (ELECTROSURGICAL) ×2
ELECTRODE REM PT RTRN 9FT ADLT (ELECTROSURGICAL) ×1 IMPLANT
GLOVE BIOGEL PI IND STRL 6.5 (GLOVE) ×1 IMPLANT
GLOVE BIOGEL PI IND STRL 8 (GLOVE) ×1 IMPLANT
GLOVE BIOGEL PI INDICATOR 6.5 (GLOVE) ×1
GLOVE BIOGEL PI INDICATOR 8 (GLOVE) ×1
GLOVE ECLIPSE 8.0 STRL XLNG CF (GLOVE) ×2 IMPLANT
GLOVE SURG SS PI 6.5 STRL IVOR (GLOVE) ×2 IMPLANT
GOWN STRL REUS W/TWL LRG LVL3 (GOWN DISPOSABLE) ×2 IMPLANT
GOWN STRL REUS W/TWL XL LVL3 (GOWN DISPOSABLE) ×3 IMPLANT
HEMOSTAT SPONGE AVITENE ULTRA (HEMOSTASIS) ×1 IMPLANT
KIT BASIN OR (CUSTOM PROCEDURE TRAY) ×2 IMPLANT
KIT POSITION SHOULDER SCHLEI (MISCELLANEOUS) ×2 IMPLANT
LIQUID BAND (GAUZE/BANDAGES/DRESSINGS) ×2 IMPLANT
MANIFOLD NEPTUNE II (INSTRUMENTS) ×2 IMPLANT
NDL MA TROC 1/2 (NEEDLE) IMPLANT
NEEDLE MA TROC 1/2 (NEEDLE) IMPLANT
NS IRRIG 1000ML POUR BTL (IV SOLUTION) ×1 IMPLANT
PACK SHOULDER (CUSTOM PROCEDURE TRAY) ×2 IMPLANT
POSITIONER SURGICAL ARM (MISCELLANEOUS) ×2 IMPLANT
SLING ARM IMMOBILIZER LRG (SOFTGOODS) ×2 IMPLANT
SPONGE LAP 4X18 X RAY DECT (DISPOSABLE) IMPLANT
SPONGE SURGIFOAM ABS GEL 100 (HEMOSTASIS) ×2 IMPLANT
STAPLER VISISTAT 35W (STAPLE) IMPLANT
SUCTION FRAZIER HANDLE 12FR (TUBING) ×1
SUCTION TUBE FRAZIER 12FR DISP (TUBING) ×1 IMPLANT
SUT BONE WAX W31G (SUTURE) ×2 IMPLANT
SUT ETHIBOND NAB CT1 #1 30IN (SUTURE) ×2 IMPLANT
SUT MNCRL AB 4-0 PS2 18 (SUTURE) ×2 IMPLANT
SUT VIC AB 0 CT1 27 (SUTURE) ×4
SUT VIC AB 0 CT1 27XBRD ANTBC (SUTURE) IMPLANT
SUT VIC AB 1 CT1 27 (SUTURE) ×4
SUT VIC AB 1 CT1 27XBRD ANTBC (SUTURE) ×2 IMPLANT
SUT VIC AB 2-0 CT1 27 (SUTURE) ×4
SUT VIC AB 2-0 CT1 TAPERPNT 27 (SUTURE) ×2 IMPLANT
TOWEL OR 17X26 10 PK STRL BLUE (TOWEL DISPOSABLE) ×2 IMPLANT

## 2016-03-09 NOTE — Brief Op Note (Signed)
03/09/2016  11:10 AM  PATIENT:  Eda Keys  55 y.o. female  PRE-OPERATIVE DIAGNOSIS:  RIGHT ROTATOR CUFF TEAR and [place] Obesity  POST-OPERATIVE DIAGNOSIS:  RIGHT ROTATOR CUFF TEAR and [place] Obesity/  PROCEDURE:  Procedure(s): OPEN RIGHT ROTATOR CUFF REPAIR WITH GRAFT AND ANCHORS (Right),Complex.  SURGEON:  Surgeon(s) and Role:    * Latanya Maudlin, MD - Primary  PHYSICIAN ASSISTANT: Ardeen Jourdain PA  ASSISTANTS: Ardeen Jourdain PA  ANESTHESIA:   general  EBL:  No intake/output data recorded.  BLOOD ADMINISTERED:none  DRAINS: none   LOCAL MEDICATIONS USED:  OTHER Inter Scalene Nerve Block by Anesthesia.  SPECIMEN:  No Specimen  DISPOSITION OF SPECIMEN:  N/A  COUNTS:  YES  TOURNIQUET:  * No tourniquets in log *  DICTATION: .Other Dictation: Dictation Number [id number]  PLAN OF CARE: Admit for overnight observation  PATIENT DISPOSITION:  Stable in OR   Delay start of Pharmacological VTE agent (>24hrs) due to surgical blood loss or risk of bleeding: yes

## 2016-03-09 NOTE — Anesthesia Preprocedure Evaluation (Addendum)
Anesthesia Evaluation  Patient identified by MRN, date of birth, ID band Patient awake    Reviewed: Allergy & Precautions, NPO status , Patient's Chart, lab work & pertinent test results  History of Anesthesia Complications Negative for: history of anesthetic complications  Airway Mallampati: II  TM Distance: >3 FB Neck ROM: Full    Dental  (+) Missing, Dental Advisory Given   Pulmonary neg pulmonary ROS,    Pulmonary exam normal        Cardiovascular hypertension, Normal cardiovascular exam     Neuro/Psych negative neurological ROS  negative psych ROS   GI/Hepatic negative GI ROS, Neg liver ROS,   Endo/Other  Hypothyroidism   Renal/GU negative Renal ROS     Musculoskeletal   Abdominal   Peds  Hematology   Anesthesia Other Findings   Reproductive/Obstetrics                            Anesthesia Physical Anesthesia Plan  ASA: II  Anesthesia Plan: General   Post-op Pain Management: GA combined w/ Regional for post-op pain   Induction: Intravenous  Airway Management Planned: Oral ETT  Additional Equipment:   Intra-op Plan:   Post-operative Plan: Extubation in OR  Informed Consent: I have reviewed the patients History and Physical, chart, labs and discussed the procedure including the risks, benefits and alternatives for the proposed anesthesia with the patient or authorized representative who has indicated his/her understanding and acceptance.   Dental advisory given  Plan Discussed with: CRNA, Anesthesiologist and Surgeon  Anesthesia Plan Comments:        Anesthesia Quick Evaluation

## 2016-03-09 NOTE — Progress Notes (Signed)
Assisted Dr. Singer with right, ultrasound guided, interscalene  block. Side rails up, monitors on throughout procedure. See vital signs in flow sheet. Tolerated Procedure well. 

## 2016-03-09 NOTE — Anesthesia Postprocedure Evaluation (Signed)
Anesthesia Post Note  Patient: Courtney Collins  Procedure(s) Performed: Procedure(s) (LRB): OPEN RIGHT ROTATOR CUFF REPAIR WITH GRAFT AND ANCHORS (Right)  Patient location during evaluation: PACU Anesthesia Type: General Level of consciousness: sedated Pain management: pain level controlled Vital Signs Assessment: post-procedure vital signs reviewed and stable Respiratory status: spontaneous breathing and respiratory function stable Cardiovascular status: stable Anesthetic complications: no    Last Vitals:  Vitals:   03/09/16 1145 03/09/16 1200  BP: 124/86 139/87  Pulse: 78   Resp: 20   Temp:      Last Pain:  Vitals:   03/09/16 1230  TempSrc:   PainSc: (P) 0-No pain                 Fredis Malkiewicz DANIEL

## 2016-03-09 NOTE — Anesthesia Procedure Notes (Signed)
Anesthesia Regional Block:  Interscalene brachial plexus block  Pre-Anesthetic Checklist: ,, timeout performed, Correct Patient, Correct Site, Correct Laterality, Correct Procedure,, site marked, risks and benefits discussed, Surgical consent,  Pre-op evaluation,  At surgeon's request and post-op pain management  Laterality: Right  Prep: chloraprep       Needles:  Injection technique: Single-shot  Needle Type: Echogenic Stimulator Needle     Needle Length: 5cm 5 cm Needle Gauge: 22 and 22 G    Additional Needles:  Procedures: ultrasound guided (picture in chart) and nerve stimulator Interscalene brachial plexus block  Nerve Stimulator or Paresthesia:  Response: bicep contraction, 0.48 mA,   Additional Responses:   Narrative:  Start time: 03/09/2016 9:44 AM End time: 03/09/2016 9:54 AM Injection made incrementally with aspirations every 5 mL.  Performed by: Personally   Additional Notes: Functioning IV was confirmed and monitors applied.  A 67mm 22ga echogenic arrow stimulator was used. Sterile prep and drape,hand hygiene and sterile gloves were used.Ultrasound guidance: relevent anatomy identified, needle position confirmed, local anesthetic spread visualized around nerve(s)., vascular puncture avoided.  Image printed for medical record.  Negative aspiration and negative test dose prior to incremental administration of local anesthetic. The patient tolerated the procedure well.

## 2016-03-09 NOTE — Anesthesia Procedure Notes (Signed)
Procedure Name: Intubation Date/Time: 03/09/2016 10:16 AM Performed by: Lind Covert Pre-anesthesia Checklist: Patient identified, Emergency Drugs available, Suction available, Patient being monitored and Timeout performed Patient Re-evaluated:Patient Re-evaluated prior to inductionOxygen Delivery Method: Circle system utilized Preoxygenation: Pre-oxygenation with 100% oxygen Intubation Type: IV induction Laryngoscope Size: Mac and 4 Grade View: Grade I Tube type: Oral Tube size: 7.0 mm Number of attempts: 1 Airway Equipment and Method: Stylet Placement Confirmation: ETT inserted through vocal cords under direct vision,  positive ETCO2 and breath sounds checked- equal and bilateral Secured at: 22 cm Tube secured with: Tape Dental Injury: Teeth and Oropharynx as per pre-operative assessment

## 2016-03-09 NOTE — H&P (Signed)
Courtney Collins is an 55 y.o. female.   Chief Complaint: right shoulder pain HPI: The patient presented with the chief complaint of right shoulder pain with progressively worsening symptoms including pain and weakness. MRI showed a torn right rotator cuff.   Past Medical History:  Diagnosis Date  . Arthritis   . Hypertension   . Hypothyroidism   . Obesity (BMI 30-39.9)     Past Surgical History:  Procedure Laterality Date  . NO PAST SURGERIES      Family History  Problem Relation Age of Onset  . Hypertension Mother   . Cancer Maternal Aunt   . Cancer Maternal Uncle    Social History:  reports that she has never smoked. She has never used smokeless tobacco. She reports that she does not drink alcohol or use drugs.  Allergies: No Known Allergies  Medications Prior to Admission  Medication Sig Dispense Refill  . atorvastatin (LIPITOR) 20 MG tablet Take 20 mg by mouth daily.    Marland Kitchen levothyroxine (SYNTHROID, LEVOTHROID) 150 MCG tablet take 1 tablet by mouth once daily 30 tablet 0  . triamterene-hydrochlorothiazide (MAXZIDE-25) 37.5-25 MG per tablet take 1 tablet by mouth once daily 30 tablet 0    Results for orders placed or performed during the hospital encounter of 03/09/16 (from the past 48 hour(s))  ABO/Rh     Status: None   Collection Time: 03/09/16  8:00 AM  Result Value Ref Range   ABO/RH(D) O POS   Type and screen [place]     Status: None   Collection Time: 03/09/16  8:25 AM  Result Value Ref Range   ABO/RH(D) O POS    Antibody Screen NEG    Sample Expiration 03/12/2016   hCG, serum, qualitative     Status: None   Collection Time: 03/09/16  8:25 AM  Result Value Ref Range   Preg, Serum NEGATIVE NEGATIVE    Comment:        THE SENSITIVITY OF THIS METHODOLOGY IS >10 mIU/mL.     Review of Systems  Constitutional: Negative.   HENT: Negative.   Eyes: Negative.   Respiratory: Negative.   Cardiovascular: Negative.   Gastrointestinal:  Negative.   Genitourinary: Negative.   Musculoskeletal: Positive for joint pain and myalgias. Negative for back pain, falls and neck pain.       Right shoulder pain  Skin: Negative.   Neurological: Negative.   Endo/Heme/Allergies: Negative.   Psychiatric/Behavioral: Negative.     Blood pressure (!) 140/94, pulse 87, temperature 99.1 F (37.3 C), temperature source Oral, resp. rate 16, height 5\' 3"  (1.6 m), weight 108 kg (238 lb), last menstrual period 02/19/2016, SpO2 97 %. Physical Exam  Constitutional: She is oriented to person, place, and time. She appears well-developed. No distress.  Obese  HENT:  Head: Normocephalic and atraumatic.  Right Ear: External ear normal.  Left Ear: External ear normal.  Nose: Nose normal.  Mouth/Throat: Oropharynx is clear and moist.  Eyes: Conjunctivae and EOM are normal.  Neck: Normal range of motion. Neck supple.  Cardiovascular: Normal rate, regular rhythm, normal heart sounds and intact distal pulses.   No murmur heard. Respiratory: Effort normal and breath sounds normal. No respiratory distress.  GI: Soft. Bowel sounds are normal. She exhibits no distension. There is no tenderness.  Musculoskeletal:       Right shoulder: She exhibits decreased range of motion, pain and decreased strength. She exhibits no tenderness and no bony tenderness.  Left shoulder: Normal.       Right elbow: Normal.      Left elbow: Normal.  Neurological: She is alert and oriented to person, place, and time. She has normal strength and normal reflexes. No sensory deficit.  Skin: No rash noted. She is not diaphoretic. No erythema.  Psychiatric: She has a normal mood and affect. Her behavior is normal.     Assessment/Plan Right shoulder rotator cuff tear She needs a right shoulder open rotator cuff repair with graft and anchors. Risks and benefits of the procedure discussed with the patient by Dr. Gladstone Lighter.    H&P performed by Dr Macarthur Critchley  Gissella Niblack,  Jnaya Butrick Ander Purpura, PA-C 03/09/2016, 9:33 AM

## 2016-03-09 NOTE — Transfer of Care (Signed)
Immediate Anesthesia Transfer of Care Note  Patient: Courtney Collins  Procedure(s) Performed: Procedure(s): OPEN RIGHT ROTATOR CUFF REPAIR WITH GRAFT AND ANCHORS (Right)  Patient Location: PACU  Anesthesia Type:General  Level of Consciousness: sedated  Airway & Oxygen Therapy: Patient Spontanous Breathing and Patient connected to face mask oxygen  Post-op Assessment: Report given to RN and Post -op Vital signs reviewed and stable  Post vital signs: Reviewed and stable  Last Vitals:  Vitals:   03/09/16 1007 03/09/16 1008  BP:    Pulse: 78 77  Resp: 16 16  Temp:      Last Pain:  Vitals:   03/09/16 0805  TempSrc:   PainSc: 3       Patients Stated Pain Goal: 4 ([id number])  Complications: No apparent anesthesia complications

## 2016-03-09 NOTE — Op Note (Signed)
Courtney Collins, Courtney Collins             ACCOUNT NO.:  [id number]  MEDICAL RECORD NO.:  [id number]  LOCATION:  2                         FACILITY:  [facility]  PHYSICIAN:  Kipp Brood. Martel Galvan, M.D.DATE OF BIRTH:  1960/11/17  DATE OF PROCEDURE:  03/09/2016 DATE OF DISCHARGE:                              OPERATIVE REPORT   SURGEON:  Kipp Brood. Gladstone Lighter, M.D.  ASSISTANT:  Ardeen Jourdain, [place]  PREOPERATIVE DIAGNOSES: 1. Severe impingement syndrome, right shoulder. 2. Complete tear of rotator cuff tendon, right shoulder.  POSTOPERATIVE DIAGNOSES: 1. Severe impingement syndrome, right shoulder. 2. Complete tear of rotator cuff tendon, right shoulder.  OPERATIONS: 1. Open acromionectomy and acromioplasty, right shoulder. 2. Repair of a rotator cuff tendon, right shoulder. 3. Dermis band graft, right shoulder. 4. One anchor was used for fixation of the graft.  DESCRIPTION OF PROCEDURE:  Under general anesthesia, routine orthopedic prep and draping of the right shoulder was carried out.  The appropriate time-out was carried out.  I also marked the appropriate right shoulder in the holding area.  She had 2 g of IV Ancef preop.  She also had an interscalene nerve block by Anesthesia preop.  At this time, incision was made over the anterior aspect of the right shoulder.  Bleeders were identified and cauterized.  Note the lady was quite obese.  We had to go down quite deep in order to see the deltoid tendon.  We inserted our retractors as we went down to the shoulder per Se.  Bleeders were identified and cauterized along the way.  At this time, I partially detached the deltoid tendon from the acromion.  She had a large thickened overgrowth of the acromion with severe impingement.  We protected the underlying cuff with Bennett retractor and then utilized the oscillating saw and did a partial acromionectomy and then used a bur to even out the undersurface.  Once this was done, we had nice  re- established the subacromial space.  Bleeders were identified and cauterized.  We externally rotated the arm and there was a large tear in the rotator cuff.  We partially retracted that, utilized the bur to bur the lateral articular surface of the humerus for bleeding purposes for the repair to take through the bone.  At this time, we sutured the rotator cuff tendon defect followed by application of a Dermis band graft with one anchor for fixation purposes.  We thoroughly irrigated out the area and then inserted some Ultrafoam anticoagulant material into the subacromial space.  We also bone waxed the undersurface of the acromion.  At this time, we then reapproximated the deltoid tendon and muscle in usual fashion.  Subcu was closed in usual fashion.  Skin was closed with a running locking Monocryl suture. Sterile dressings were applied.  She was placed in a shoulder immobilizer.  She will be admitted overnight.          ______________________________ Kipp Brood. Gladstone Lighter, M.D.     RAG/MEDQ  D:  03/09/2016  T:  03/09/2016  Job:  [id number]

## 2016-03-09 NOTE — Interval H&P Note (Signed)
History and Physical Interval Note:  03/09/2016 9:38 AM  Courtney Collins  has presented today for surgery, with the diagnosis of RIGHT ROTATOR CUFF TEAR   The various methods of treatment have been discussed with the patient and family. After consideration of risks, benefits and other options for treatment, the patient has consented to  Procedure(s): OPEN RIGHT ROTATOR CUFF REPAIR WITH GRAFT AND ANCHORS (Right) as a surgical intervention .  The patient's history has been reviewed, patient examined, no change in status, stable for surgery.  I have reviewed the patient's chart and labs.  Questions were answered to the patient's satisfaction.     Tieisha Darden A

## 2016-03-10 DIAGNOSIS — M7541 Impingement syndrome of right shoulder: Secondary | ICD-10-CM | POA: Diagnosis not present

## 2016-03-10 MED ORDER — HYDROMORPHONE HCL 2 MG PO TABS
2.0000 mg | ORAL_TABLET | ORAL | Status: DC | PRN
Start: 2016-03-10 — End: 2016-03-10
  Administered 2016-03-10 (×2): 4 mg via ORAL
  Filled 2016-03-10 (×2): qty 2

## 2016-03-10 MED ORDER — ASPIRIN 325 MG PO TBEC: 325.0000 mg | DELAYED_RELEASE_TABLET | Freq: Every day | ORAL | 0 refills | Status: DC

## 2016-03-10 MED ORDER — HYDROMORPHONE HCL 2 MG PO TABS: 2.0000 mg | ORAL_TABLET | ORAL | 0 refills | Status: DC | PRN

## 2016-03-10 NOTE — Evaluation (Signed)
Occupational Therapy Evaluation Patient Details Name: Courtney Collins MRN: [id number] DOB: 04-26-1961 Today's Date: 03/10/2016    History of Present Illness s/p R RCR with graft and anchors   Clinical Impression   This 55 year old female was admitted for the above sx.  Will follow in acute for family education.  Shoulder protocol issued and pt verbalizes understanding.  She will follow up with Dr Gladstone Lighter for further rehab needs.    Follow Up Recommendations   (pt will follow up with Dr Gladstone Lighter)    Equipment Recommendations  None recommended by OT    Recommendations for Other Services       Precautions / Restrictions Precautions Precautions: Shoulder Type of Shoulder Precautions: shoulder sling at all times except for bathing/dressing; no shoulder movement; elbow to finger ROM Precaution Booklet Issued: Yes (comment) Required Braces or Orthoses: Sling Restrictions Weight Bearing Restrictions: Yes Other Position/Activity Restrictions: NWB      Mobility Bed Mobility Overal bed mobility: Modified Independent (hob raised)                Transfers Overall transfer level: Independent                    Balance                                            ADL Overall ADL's : Needs assistance/impaired     Grooming: Set up;Sitting   Upper Body Bathing: Minimal assitance;Sitting   Lower Body Bathing: Minimal assistance;Sit to/from stand   Upper Body Dressing : Moderate assistance;Sitting   Lower Body Dressing: Minimal assistance   Toilet Transfer: Supervision/safety;Ambulation             General ADL Comments: educated on NWB during ADLs and shoulder protocol/need to keep R shoulder still during adls.  Bathed under arm and will perform dressing and sling when her mother gets here.  Reviewed and issued shoulder protocol.  Pt verbalizes understanding of all education     Vision     Perception     Praxis      Pertinent  Vitals/Pain Pain Assessment: 0-10 Pain Score: 3  Pain Location: R shoulder Pain Descriptors / Indicators: Sore Pain Intervention(s): Limited activity within patient's tolerance;Monitored during session;Premedicated before session;Repositioned;Heat applied     Hand Dominance Right   Extremity/Trunk Assessment Upper Extremity Assessment Upper Extremity Assessment: RUE deficits/detail (immobilized; fingers to elbow wfls)           Communication Communication Communication: No difficulties   Cognition Arousal/Alertness: Awake/alert Behavior During Therapy: WFL for tasks assessed/performed Overall Cognitive Status: Within Functional Limits for tasks assessed                     General Comments       Exercises       Shoulder Instructions      Home Living Family/patient expects to be discharged to:: Private residence Living Arrangements: Other relatives Available Help at Discharge:  (sister)               Bathroom Shower/Tub: Tub/shower unit Shower/tub characteristics: Architectural technologist: Standard     Home Equipment: None          Prior Functioning/Environment Level of Independence: Independent             OT Diagnosis: Acute pain   OT  Problem List: Pain;Decreased range of motion;Decreased strength (decreased family education)   OT Treatment/Interventions: Self-care/ADL training;DME and/or AE instruction;Patient/family education    OT Goals(Current goals can be found in the care plan section) Acute Rehab OT Goals Patient Stated Goal: RUE healed OT Goal Formulation: With patient Time For Goal Achievement: 03/17/16 Potential to Achieve Goals: Good ADL Goals Additional ADL Goal #1: family will verbalize vs demonstrate UB dressing and sling application  OT Frequency: Min 1X/week   Barriers to D/C:            Co-evaluation              End of Session    Activity Tolerance: Patient tolerated treatment well Patient left: in  chair;with call bell/phone within reach   Time: 0824-0851 OT Time Calculation (min): 27 min Charges:  OT General Charges $OT Visit: 1 Procedure OT Evaluation $OT Eval Low Complexity: 1 Procedure G-Codes: OT G-codes **NOT FOR INPATIENT CLASS** Functional Assessment Tool Used: clinical observation and judgment Functional Limitation: Self care Self Care Goal Status [id number]): At least 1 percent but less than 20 percent impaired, limited or restricted  [facility] 03/10/2016, 9:11 AM Lesle Chris, OTR/L [phone] 03/10/2016

## 2016-03-10 NOTE — Discharge Instructions (Signed)
Keep your sling on at all times, including sleeping in your sling. The only time you should remove your sling is to shower only but you need to keep your hand against your chest while you shower.  You may shower with the dressing on. It is waterproof.  If the dressing becomes compromised, you may remove and dress the incision with gauze and paper tape.  Call Dr. Gladstone Lighter if any wound complications or temperature of 101 degrees F or over.  Call the office for an appointment to see Dr. Gladstone Lighter in two weeks: [phone] and ask for Dr. Charlestine Night nurse, Brunilda Payor.

## 2016-03-10 NOTE — Progress Notes (Signed)
Discharged from floor via w/c for transport home by car. Belongings and mother with pt. No changes in assessment. Elliette Seabolt, [employer]

## 2016-03-10 NOTE — Progress Notes (Signed)
   03/10/16 1000  OT Visit Information  Last OT Received On 03/10/16  Assistance Needed +1  History of Present Illness s/p R RCR with graft and anchors  Precautions  Precautions Shoulder  Type of Shoulder Precautions shoulder sling at all times except for bathing/dressing; no shoulder movement; elbow to finger ROM  Shoulder Interventions Shoulder sling/immobilizer  Pain Assessment  Pain Assessment No/denies pain  Pain Score 5  Pain Location R shoulder  Pain Descriptors / Indicators Aching  Pain Intervention(s) Limited activity within patient's tolerance;Monitored during session;Premedicated before session;Repositioned;Ice applied  Cognition  Arousal/Alertness Awake/alert  Behavior During Therapy WFL for tasks assessed/performed  Overall Cognitive Status Within Functional Limits for tasks assessed  ADL  Upper Body Dressing  Moderate assistance;Sitting  General ADL Comments mother present for education. Sister will assist pt at home, but mother and pt both feel that they can instruct her on assistance with adls and sling donning/doffing.  reviewed UB bathing and positioning for sleeping  Transfers  Overall transfer level Independent  OT - End of Session  Activity Tolerance Patient tolerated treatment well  Patient left in chair;with call bell/phone within reach  Nurse Communication (ready for d/c)  OT Assessment/Plan  Follow Up Recommendations (pt will follow up with Dr Gladstone Lighter)  OT Goal Progression  Progress towards OT goals Goals met/education completed, patient discharged from OT  OT Time Calculation  OT Start Time (ACUTE ONLY) 0953  OT Stop Time (ACUTE ONLY) 1007  OT Time Calculation (min) 14 min  OT G-codes **NOT FOR INPATIENT CLASS**  Functional Assessment Tool Used clinical observation and judgment  Functional Limitation Self care  Self Care Discharge Status ([id number]) CI  OT General Charges  $OT Visit 1 Procedure  OT Treatments  $Self Care/Home Management  8-22 mins   Lesle Chris, OTR/L [phone] 03/10/2016

## 2016-03-10 NOTE — Progress Notes (Signed)
Subjective: 1 Day Post-Op Procedure(s) (LRB): OPEN RIGHT ROTATOR CUFF REPAIR WITH GRAFT AND ANCHORS (Right) Patient reports pain as 5 on 0-10 scale.  Doing well. Sling adjusted.   Objective: Vital signs in last 24 hours: Temp:  [97.5 F (36.4 C)-99.1 F (37.3 C)] 97.9 F (36.6 C) (07/27 0248) Pulse Rate:  [64-104] 88 (07/27 0248) Resp:  [12-22] 16 (07/27 0248) BP: (122-149)/(61-94) 133/87 (07/27 0248) SpO2:  [90 %-100 %] 98 % (07/27 0248) Weight:  [108 kg (238 lb)] 108 kg (238 lb) (07/26 1446)  Intake/Output from previous day: 07/26 0701 - 07/27 0700 In: 2295 [P.O.:240; I.V.:2055] Out: 710 [Urine:710] Intake/Output this shift: No intake/output data recorded.  No results for input(s): HGB in the last 72 hours. No results for input(s): WBC, RBC, HCT, PLT in the last 72 hours. No results for input(s): NA, K, CL, CO2, BUN, CREATININE, GLUCOSE, CALCIUM in the last 72 hours. No results for input(s): LABPT, INR in the last 72 hours.  Sensation intact distally  Assessment/Plan: 1 Day Post-Op Procedure(s) (LRB): OPEN RIGHT ROTATOR CUFF REPAIR WITH GRAFT AND ANCHORS (Right) Discharge home with home health Office Spirometry Results:   in one and a half weeks.  Ariannie Penaloza A 03/10/2016, 7:21 AM

## 2016-03-11 NOTE — Discharge Summary (Signed)
Physician Discharge Summary   Patient ID: Courtney Collins MRN: [id number] DOB/AGE: 1960/08/22 55 y.o.  Admit date: 03/09/2016 Discharge date: 03/10/2016  Primary Diagnosis: Right shoulder rotator cuff tear   Admission Diagnoses:  Past Medical History:  Diagnosis Date  . Arthritis   . Hypertension   . Hypothyroidism   . Obesity (BMI 30-39.9)    Discharge Diagnoses:   Active Problems:   Complete tear of rotator cuff  Estimated body mass index is 42.16 kg/m as calculated from the following:   Height as of this encounter: '5\' 3"'$  (1.6 m).   Weight as of this encounter: 108 kg (238 lb).  Procedure:  Procedure(s) (LRB): OPEN RIGHT ROTATOR CUFF REPAIR WITH GRAFT AND ANCHORS (Right)   Consults: None  HPI: The patient presented with the chief complaint of right shoulder pain. She reported progressive right shoulder pain with worsening weakness following walking her dog. MRI showed a torn right rotator cuff.    Laboratory Data: Admission on 03/09/2016, Discharged on 03/10/2016  Component Date Value Ref Range Status  . ABO/RH(D) 03/09/2016 O POS   Final  . Antibody Screen 03/09/2016 NEG   Final  . Sample Expiration 03/09/2016 03/12/2016   Final  . Preg, Serum 03/09/2016 NEGATIVE  NEGATIVE Final   Comment:        THE SENSITIVITY OF THIS METHODOLOGY IS >10 mIU/mL.   . ABO/RH(D) 03/09/2016 O POS   Final  Hospital Outpatient Visit on 02/19/2016  Component Date Value Ref Range Status  . aPTT 02/19/2016 31  24 - 37 seconds Final  . WBC 02/19/2016 6.8  4.0 - 10.5 K/uL Final  . RBC 02/19/2016 4.67  3.87 - 5.11 MIL/uL Final  . Hemoglobin 02/19/2016 13.4  12.0 - 15.0 g/dL Final  . HCT 02/19/2016 38.7  36.0 - 46.0 % Final  . MCV 02/19/2016 82.9  78.0 - 100.0 fL Final  . MCH 02/19/2016 28.7  26.0 - 34.0 pg Final  . MCHC 02/19/2016 34.6  30.0 - 36.0 g/dL Final  . RDW 02/19/2016 13.1  11.5 - 15.5 % Final  . Platelets 02/19/2016 330  150 - 400 K/uL Final  . Neutrophils Relative %  02/19/2016 64  % Final  . Neutro Abs 02/19/2016 4.3  1.7 - 7.7 K/uL Final  . Lymphocytes Relative 02/19/2016 22  % Final  . Lymphs Abs 02/19/2016 1.5  0.7 - 4.0 K/uL Final  . Monocytes Relative 02/19/2016 7  % Final  . Monocytes Absolute 02/19/2016 0.4  0.1 - 1.0 K/uL Final  . Eosinophils Relative 02/19/2016 7  % Final  . Eosinophils Absolute 02/19/2016 0.5  0.0 - 0.7 K/uL Final  . Basophils Relative 02/19/2016 0  % Final  . Basophils Absolute 02/19/2016 0.0  0.0 - 0.1 K/uL Final  . Sodium 02/19/2016 138  135 - 145 mmol/L Final  . Potassium 02/19/2016 3.4* 3.5 - 5.1 mmol/L Final  . Chloride 02/19/2016 103  101 - 111 mmol/L Final  . CO2 02/19/2016 27  22 - 32 mmol/L Final  . Glucose, Bld 02/19/2016 86  65 - 99 mg/dL Final  . BUN 02/19/2016 15  6 - 20 mg/dL Final  . Creatinine, Ser 02/19/2016 0.64  0.44 - 1.00 mg/dL Final  . Calcium 02/19/2016 9.1  8.9 - 10.3 mg/dL Final  . Total Protein 02/19/2016 8.1  6.5 - 8.1 g/dL Final  . Albumin 02/19/2016 4.6  3.5 - 5.0 g/dL Final  . AST 02/19/2016 26  15 - 41 U/L Final  . ALT  02/19/2016 21  14 - 54 U/L Final  . Alkaline Phosphatase 02/19/2016 66  38 - 126 U/L Final  . Total Bilirubin 02/19/2016 0.7  0.3 - 1.2 mg/dL Final  . GFR calc non Af Amer 02/19/2016 >60  >60 mL/min Final  . GFR calc Af Amer 02/19/2016 >60  >60 mL/min Final   Comment: (NOTE) The eGFR has been calculated using the CKD EPI equation. This calculation has not been validated in all clinical situations. eGFR's persistently <60 mL/min signify possible Chronic Kidney Disease.   . Anion gap 02/19/2016 8  5 - 15 Final  . Prothrombin Time 02/19/2016 12.3  11.6 - 15.2 seconds Final  . INR 02/19/2016 0.90  0.00 - 1.49 Final  . Preg, Serum 02/19/2016 NEGATIVE  NEGATIVE Final   Comment:        THE SENSITIVITY OF THIS METHODOLOGY IS >10 mIU/mL.        Hospital Course: Courtney Collins is a 55 y.o. who was admitted to [facility]. They were brought to the operating  room on 03/09/2016 and underwent Procedure(s): OPEN RIGHT ROTATOR CUFF REPAIR WITH GRAFT AND ANCHORS.  Patient tolerated the procedure well and was later transferred to the recovery room and then to the orthopaedic floor for postoperative care.  They were given PO and IV analgesics for pain control following their surgery.  They were given 24 hours of postoperative antibiotics of  Anti-infectives    Start     Dose/Rate Route Frequency Ordered Stop   03/09/16 1600  ceFAZolin (ANCEF) IVPB 1 g/50 mL premix     1 g 100 mL/hr over 30 Minutes Intravenous Every 6 hours 03/09/16 1456 03/10/16 0516   03/09/16 1055  polymyxin B 500,000 Units, bacitracin 50,000 Units in sodium chloride irrigation 0.9 % 500 mL irrigation  Status:  Discontinued       As needed 03/09/16 1055 03/09/16 1133   03/09/16 0747  ceFAZolin (ANCEF) IVPB 2g/100 mL premix     2 g 200 mL/hr over 30 Minutes Intravenous On call to O.R. 03/09/16 0747 03/09/16 1020     and started on DVT prophylaxis in the form of Aspirin.   POT were ordered for ADL instruction and sling management. Discharge planning consulted to help with postop disposition and equipment needs.  Patient had a fair night on the evening of surgery.  They started to get up OOB with therapy on day one. Patient was seen in rounds and was ready to go home.   Diet: Cardiac diet Activity:Wear sling at all times. No lifting. Follow-up:in 10 days Disposition - Home Discharged Condition: stable   Discharge Instructions    Call MD / Call 911    Complete by:  As directed   If you experience chest pain or shortness of breath, CALL 911 and be transported to the hospital emergency room.  If you develope a fever above 101 F, pus (white drainage) or increased drainage or redness at the wound, or calf pain, call your surgeon's office.   Constipation Prevention    Complete by:  As directed   Drink plenty of fluids.  Prune juice may be helpful.  You may use a stool softener, such as  Colace (over the counter) 100 mg twice a day.  Use MiraLax (over the counter) for constipation as needed.   Diet - low sodium heart healthy    Complete by:  As directed   Discharge instructions    Complete by:  As directed   Keep  your sling on at all times, including sleeping in your sling. The only time you should remove your sling is to shower only but you need to keep your hand against your chest while you shower.  You may shower with the dressing on. It is waterproof.  If the dressing becomes compromised, you may remove and dress the incision with gauze and paper tape.  Call Dr. Gladstone Lighter if any wound complications or temperature of 101 degrees F or over.  Call the office for an appointment to see Dr. Gladstone Lighter in two weeks: [phone] and ask for Dr. Charlestine Night nurse, Brunilda Payor.   Increase activity slowly as tolerated    Complete by:  As directed       Medication List    TAKE these medications   aspirin 325 MG EC tablet Take 1 tablet (325 mg total) by mouth daily. To prevent blood clots   atorvastatin 20 MG tablet Commonly known as:  LIPITOR Take 20 mg by mouth daily.   HYDROmorphone 2 MG tablet Commonly known as:  DILAUDID Take 1-2 tablets (2-4 mg total) by mouth every 4 (four) hours as needed for severe pain.   levothyroxine 150 MCG tablet Commonly known as:  SYNTHROID, LEVOTHROID take 1 tablet by mouth once daily   triamterene-hydrochlorothiazide 37.5-25 MG tablet Commonly known as:  MAXZIDE-25 take 1 tablet by mouth once daily      Follow-up Information    GIOFFRE,RONALD A, MD Follow up in 10 day(s).   Specialty:  Orthopedic Surgery Contact information: [address] [place] [address] [phone]           Signed: Ardeen Jourdain, PA-C Orthopaedic Surgery 03/11/2016, 7:50 AM

## 2017-01-31 ENCOUNTER — Other Ambulatory Visit: Payer: Self-pay | Admitting: Family Medicine

## 2017-01-31 DIAGNOSIS — M5126 Other intervertebral disc displacement, lumbar region: Secondary | ICD-10-CM

## 2017-01-31 DIAGNOSIS — M545 Low back pain, unspecified: Secondary | ICD-10-CM

## 2017-02-17 ENCOUNTER — Ambulatory Visit
Admission: RE | Admit: 2017-02-17 | Discharge: 2017-02-17 | Disposition: A | Discharge status: Home / Self Care | Payer: BLUE CROSS/BLUE SHIELD | Source: Ambulatory Visit | Attending: Family Medicine | Admitting: Family Medicine

## 2017-02-17 DIAGNOSIS — M545 Low back pain: Secondary | ICD-10-CM

## 2017-02-17 DIAGNOSIS — M5126 Other intervertebral disc displacement, lumbar region: Secondary | ICD-10-CM

## 2017-11-17 MED FILL — TRIAMTERENE-HCTZ 37.5-25 MG: 37.5-25 | 90 days supply | Qty: 90 | Fill #0

## 2017-11-17 MED FILL — LEVOTHYROXINE 150 MCG TAB: 150 | 90 days supply | Qty: 90 | Fill #0

## 2017-11-17 MED FILL — ATORVASTATIN 20 MG TABLET: 20 | 30 days supply | Qty: 30 | Fill #0

## 2018-01-10 DIAGNOSIS — N938 Other specified abnormal uterine and vaginal bleeding: Secondary | ICD-10-CM | POA: Diagnosis not present

## 2018-01-31 ENCOUNTER — Other Ambulatory Visit: Payer: Self-pay | Admitting: Obstetrics and Gynecology

## 2018-01-31 ENCOUNTER — Other Ambulatory Visit (HOSPITAL_COMMUNITY)
Admission: RE | Admit: 2018-01-31 | Discharge: 2018-01-31 | Disposition: A | Discharge status: Home / Self Care | Payer: 59 | Source: Ambulatory Visit | Attending: Obstetrics and Gynecology | Admitting: Obstetrics and Gynecology

## 2018-01-31 DIAGNOSIS — Z01411 Encounter for gynecological examination (general) (routine) with abnormal findings: Secondary | ICD-10-CM | POA: Diagnosis not present

## 2018-01-31 DIAGNOSIS — N939 Abnormal uterine and vaginal bleeding, unspecified: Secondary | ICD-10-CM | POA: Diagnosis not present

## 2018-01-31 LAB — HM PAP SMEAR: HM Pap smear: NORMAL

## 2018-02-01 LAB — CYTOLOGY - PAP
DIAGNOSIS: NEGATIVE
HPV: NOT DETECTED

## 2018-02-13 ENCOUNTER — Ambulatory Visit: Payer: Self-pay | Admitting: Women's Health

## 2018-02-27 DIAGNOSIS — D219 Benign neoplasm of connective and other soft tissue, unspecified: Secondary | ICD-10-CM | POA: Diagnosis not present

## 2018-02-27 DIAGNOSIS — N939 Abnormal uterine and vaginal bleeding, unspecified: Secondary | ICD-10-CM | POA: Diagnosis not present

## 2018-03-07 DIAGNOSIS — N939 Abnormal uterine and vaginal bleeding, unspecified: Secondary | ICD-10-CM | POA: Diagnosis not present

## 2018-03-07 DIAGNOSIS — D250 Submucous leiomyoma of uterus: Secondary | ICD-10-CM | POA: Diagnosis not present

## 2018-03-16 MED FILL — LEVOTHYROXINE 150 MCG TAB: 150 | 90 days supply | Qty: 90 | Fill #1

## 2018-03-16 MED FILL — TRIAMTERENE/HCTZ 37.5/25 TB: 37.5-25 | 90 days supply | Qty: 90 | Fill #0

## 2018-03-16 MED FILL — ATORVASTATIN CALCIUM 20 MG: 20 | 30 days supply | Qty: 30 | Fill #1

## 2018-05-14 NOTE — Patient Instructions (Addendum)
Courtney Collins  05/14/2018   Your procedure is scheduled on: 05-23-18  Report to [facility] Main  Entrance  Report to admitting at 630 AM    Call this number if you have problems the morning of surgery [phone]   Remember: Do not eat food or drink liquids :After Midnight. BRUSH YOUR TEETH MORNING OF SURGERY AND RINSE YOUR MOUTH OUT, NO CHEWING GUM CANDY OR MINTS.     Take these medicines the morning of surgery with A SIP OF WATER: atorvastatin (lipitor), levothryroxine (synthroid)                               You may not have any metal on your body including hair pins and              piercings  Do not wear jewelry, make-up, lotions, powders or perfumes, deodorant             Do not wear nail polish.  Do not shave  48 hours prior to surgery.              Men may shave face and neck.   Do not bring valuables to the hospital. [place].  Contacts, dentures or bridgework may not be worn into surgery.  Leave suitcase in the car. After surgery it may be brought to your room.                  Please read over the following fact sheets you were given: _____________________________________________________________________             [facility] - Preparing for Surgery Before surgery, you can play an important role.  Because skin is not sterile, your skin needs to be as free of germs as possible.  You can reduce the number of germs on your skin by washing with CHG (chlorahexidine gluconate) soap before surgery.  CHG is an antiseptic cleaner which kills germs and bonds with the skin to continue killing germs even after washing. Please DO NOT use if you have an allergy to CHG or antibacterial soaps.  If your skin becomes reddened/irritated stop using the CHG and inform your nurse when you arrive at Short Stay. Do not shave (including legs and underarms) for at least 48 hours prior to the first CHG shower.  You  may shave your face/neck. Please follow these instructions carefully:  1.  Shower with CHG Soap the night before surgery and the  morning of Surgery.  2.  If you choose to wash your hair, wash your hair first as usual with your  normal  shampoo.  3.  After you shampoo, rinse your hair and body thoroughly to remove the  shampoo.                           4.  Use CHG as you would any other liquid soap.  You can apply chg directly  to the skin and wash                       Gently with a scrungie or clean washcloth.  5.  Apply the CHG Soap to your body ONLY FROM THE  NECK DOWN.   Do not use on face/ open                           Wound or open sores. Avoid contact with eyes, ears mouth and genitals (private parts).                       Wash face,  Genitals (private parts) with your normal soap.             6.  Wash thoroughly, paying special attention to the area where your surgery  will be performed.  7.  Thoroughly rinse your body with warm water from the neck down.  8.  DO NOT shower/wash with your normal soap after using and rinsing off  the CHG Soap.                9.  Pat yourself dry with a clean towel.            10.  Wear clean pajamas.            11.  Place clean sheets on your bed the night of your first shower and do not  sleep with pets. Day of Surgery : Do not apply any lotions/deodorants the morning of surgery.  Please wear clean clothes to the hospital/surgery center.  FAILURE TO FOLLOW THESE INSTRUCTIONS MAY RESULT IN THE CANCELLATION OF YOUR SURGERY PATIENT SIGNATURE_________________________________  NURSE SIGNATURE__________________________________  ________________________________________________________________________   Adam Phenix  An incentive spirometer is a tool that can help keep your lungs clear and active. This tool measures how well you are filling your lungs with each breath. Taking long deep breaths may help reverse or decrease the chance of  developing breathing (pulmonary) problems (especially infection) following:  A long period of time when you are unable to move or be active. BEFORE THE PROCEDURE   If the spirometer includes an indicator to show your best effort, your nurse or respiratory therapist will set it to a desired goal.  If possible, sit up straight or lean slightly forward. Try not to slouch.  Hold the incentive spirometer in an upright position. INSTRUCTIONS FOR USE  1. Sit on the edge of your bed if possible, or sit up as far as you can in bed or on a chair. 2. Hold the incentive spirometer in an upright position. 3. Breathe out normally. 4. Place the mouthpiece in your mouth and seal your lips tightly around it. 5. Breathe in slowly and as deeply as possible, raising the piston or the ball toward the top of the column. 6. Hold your breath for 3-5 seconds or for as long as possible. Allow the piston or ball to fall to the bottom of the column. 7. Remove the mouthpiece from your mouth and breathe out normally. 8. Rest for a few seconds and repeat Steps 1 through 7 at least 10 times every 1-2 hours when you are awake. Take your time and take a few normal breaths between deep breaths. 9. The spirometer may include an indicator to show your best effort. Use the indicator as a goal to work toward during each repetition. 10. After each set of 10 deep breaths, practice coughing to be sure your lungs are clear. If you have an incision (the cut made at the time of surgery), support your incision when coughing by placing a pillow or rolled up towels firmly against it. Once you are able  to get out of bed, walk around indoors and cough well. You may stop using the incentive spirometer when instructed by your caregiver.  RISKS AND COMPLICATIONS  Take your time so you do not get dizzy or light-headed.  If you are in pain, you may need to take or ask for pain medication before doing incentive spirometry. It is harder to take a  deep breath if you are having pain. AFTER USE  Rest and breathe slowly and easily.  It can be helpful to keep track of a log of your progress. Your caregiver can provide you with a simple table to help with this. If you are using the spirometer at home, follow these instructions: [facility] IF:   You are having difficultly using the spirometer.  You have trouble using the spirometer as often as instructed.  Your pain medication is not giving enough relief while using the spirometer.  You develop fever of 100.5 F (38.1 C) or higher. SEEK IMMEDIATE MEDICAL CARE IF:   You cough up bloody sputum that had not been present before.  You develop fever of 102 F (38.9 C) or greater.  You develop worsening pain at or near the incision site. MAKE SURE YOU:   Understand these instructions.  Will watch your condition.  Will get help right away if you are not doing well or get worse. Document Released: 12/12/2006 Document Revised: 10/24/2011 Document Reviewed: 02/12/2007 [facility] Patient Information 2014 [place], [place].   ________________________________________________________________________

## 2018-05-16 DIAGNOSIS — N939 Abnormal uterine and vaginal bleeding, unspecified: Secondary | ICD-10-CM | POA: Diagnosis not present

## 2018-05-16 DIAGNOSIS — D219 Benign neoplasm of connective and other soft tissue, unspecified: Secondary | ICD-10-CM | POA: Diagnosis not present

## 2018-05-17 ENCOUNTER — Other Ambulatory Visit: Payer: Self-pay | Admitting: Obstetrics and Gynecology

## 2018-05-18 ENCOUNTER — Encounter (HOSPITAL_COMMUNITY): Payer: Self-pay

## 2018-05-18 ENCOUNTER — Other Ambulatory Visit: Payer: Self-pay

## 2018-05-18 ENCOUNTER — Encounter (HOSPITAL_COMMUNITY)
Admission: RE | Admit: 2018-05-18 | Discharge: 2018-05-18 | Disposition: A | Discharge status: Home / Self Care | Payer: 59 | Source: Ambulatory Visit | Attending: Obstetrics and Gynecology | Admitting: Obstetrics and Gynecology

## 2018-05-18 DIAGNOSIS — E785 Hyperlipidemia, unspecified: Secondary | ICD-10-CM | POA: Diagnosis not present

## 2018-05-18 DIAGNOSIS — E039 Hypothyroidism, unspecified: Secondary | ICD-10-CM | POA: Diagnosis not present

## 2018-05-18 DIAGNOSIS — I10 Essential (primary) hypertension: Secondary | ICD-10-CM | POA: Insufficient documentation

## 2018-05-18 DIAGNOSIS — N939 Abnormal uterine and vaginal bleeding, unspecified: Secondary | ICD-10-CM | POA: Diagnosis not present

## 2018-05-18 DIAGNOSIS — Z23 Encounter for immunization: Secondary | ICD-10-CM | POA: Diagnosis not present

## 2018-05-18 DIAGNOSIS — Z01818 Encounter for other preprocedural examination: Secondary | ICD-10-CM | POA: Diagnosis not present

## 2018-05-18 HISTORY — DX: Abnormal uterine and vaginal bleeding, unspecified: N93.9

## 2018-05-18 HISTORY — DX: Benign neoplasm of connective and other soft tissue, unspecified: D21.9

## 2018-05-18 LAB — CBC
HEMATOCRIT: 40.1 % (ref 36.0–46.0)
HEMOGLOBIN: 13.1 g/dL (ref 12.0–15.0)
MCH: 27.2 pg (ref 26.0–34.0)
MCHC: 32.7 g/dL (ref 30.0–36.0)
MCV: 83.2 fL (ref 78.0–100.0)
Platelets: 381 10*3/uL (ref 150–400)
RBC: 4.82 MIL/uL (ref 3.87–5.11)
RDW: 14.4 % (ref 11.5–15.5)
WBC: 7 10*3/uL (ref 4.0–10.5)

## 2018-05-18 LAB — BASIC METABOLIC PANEL
ANION GAP: 10 (ref 5–15)
BUN: 16 mg/dL (ref 6–20)
CALCIUM: 9.6 mg/dL (ref 8.9–10.3)
CHLORIDE: 105 mmol/L (ref 98–111)
CO2: 28 mmol/L (ref 22–32)
Creatinine, Ser: 0.86 mg/dL (ref 0.44–1.00)
GFR calc Af Amer: >60 mL/min (ref >60)
GFR calc non Af Amer: >60 mL/min (ref >60)
GLUCOSE: 98 mg/dL (ref 70–99)
Potassium: 3.9 mmol/L (ref 3.5–5.1)
Sodium: 143 mmol/L (ref 135–145)

## 2018-05-18 LAB — PREGNANCY, URINE: Preg Test, Ur: NEGATIVE

## 2018-05-18 MED FILL — ATORVASTATIN CALCIUM 20 MG: 20 | 90 days supply | Qty: 90 | Fill #0

## 2018-05-18 NOTE — Progress Notes (Signed)
CHART LEFT WITH IHECHI EWARUM FOR FOLLOW UP ON FINAL EKG RESULT

## 2018-05-22 NOTE — Anesthesia Preprocedure Evaluation (Addendum)
Anesthesia Evaluation  Patient identified by MRN, date of birth, ID band Patient awake    Reviewed: Allergy & Precautions, NPO status , Patient's Chart, lab work & pertinent test results  Airway Mallampati: III  TM Distance: >3 FB Neck ROM: Full    Dental  (+) Teeth Intact, Dental Advisory Given   Pulmonary neg pulmonary ROS,    Pulmonary exam normal breath sounds clear to auscultation       Cardiovascular hypertension, Pt. on medications (-) angina(-) CAD and (-) Past MI Normal cardiovascular exam Rhythm:Regular Rate:Normal     Neuro/Psych negative neurological ROS  negative psych ROS   GI/Hepatic negative GI ROS, Neg liver ROS,   Endo/Other  Hypothyroidism Morbid obesity  Renal/GU negative Renal ROS     Musculoskeletal  (+) Arthritis ,   Abdominal   Peds  Hematology negative hematology ROS (+)   Anesthesia Other Findings Day of surgery medications reviewed with the patient.  Reproductive/Obstetrics AUB, Fibroids                             Anesthesia Physical Anesthesia Plan  ASA: III  Anesthesia Plan: General   Post-op Pain Management:    Induction: Intravenous  PONV Risk Score and Plan: 4 or greater and Diphenhydramine, Scopolamine patch - Pre-op, Midazolam, Dexamethasone and Ondansetron  Airway Management Planned: Oral ETT  Additional Equipment:   Intra-op Plan:   Post-operative Plan: Extubation in OR  Informed Consent: I have reviewed the patients History and Physical, chart, labs and discussed the procedure including the risks, benefits and alternatives for the proposed anesthesia with the patient or authorized representative who has indicated his/her understanding and acceptance.   Dental advisory given  Plan Discussed with: CRNA  Anesthesia Plan Comments: (2nd large bore PIV after induction.)       Anesthesia Quick Evaluation

## 2018-05-23 ENCOUNTER — Other Ambulatory Visit: Payer: Self-pay | Admitting: Obstetrics and Gynecology

## 2018-05-23 ENCOUNTER — Encounter (HOSPITAL_COMMUNITY): Payer: Self-pay | Admitting: General Practice

## 2018-05-23 ENCOUNTER — Ambulatory Visit (HOSPITAL_COMMUNITY): Payer: 59 | Admitting: Anesthesiology

## 2018-05-23 ENCOUNTER — Encounter (HOSPITAL_COMMUNITY)
Admission: RE | Disposition: A | Discharge status: Home / Self Care | Payer: Self-pay | Source: Ambulatory Visit | Attending: Obstetrics and Gynecology

## 2018-05-23 ENCOUNTER — Observation Stay (HOSPITAL_COMMUNITY)
Admission: RE | Admit: 2018-05-23 | Discharge: 2018-05-24 | Disposition: A | Discharge status: Home / Self Care | Payer: 59 | Source: Ambulatory Visit | Attending: Obstetrics and Gynecology | Admitting: Obstetrics and Gynecology

## 2018-05-23 DIAGNOSIS — E785 Hyperlipidemia, unspecified: Secondary | ICD-10-CM | POA: Diagnosis not present

## 2018-05-23 DIAGNOSIS — N888 Other specified noninflammatory disorders of cervix uteri: Secondary | ICD-10-CM | POA: Insufficient documentation

## 2018-05-23 DIAGNOSIS — N840 Polyp of corpus uteri: Secondary | ICD-10-CM | POA: Diagnosis not present

## 2018-05-23 DIAGNOSIS — D250 Submucous leiomyoma of uterus: Secondary | ICD-10-CM | POA: Diagnosis not present

## 2018-05-23 DIAGNOSIS — N939 Abnormal uterine and vaginal bleeding, unspecified: Secondary | ICD-10-CM | POA: Diagnosis not present

## 2018-05-23 DIAGNOSIS — Z6841 Body Mass Index (BMI) 40.0 and over, adult: Secondary | ICD-10-CM | POA: Insufficient documentation

## 2018-05-23 DIAGNOSIS — M1990 Unspecified osteoarthritis, unspecified site: Secondary | ICD-10-CM | POA: Diagnosis not present

## 2018-05-23 DIAGNOSIS — I10 Essential (primary) hypertension: Secondary | ICD-10-CM | POA: Diagnosis not present

## 2018-05-23 DIAGNOSIS — E039 Hypothyroidism, unspecified: Secondary | ICD-10-CM | POA: Diagnosis not present

## 2018-05-23 DIAGNOSIS — D259 Leiomyoma of uterus, unspecified: Secondary | ICD-10-CM | POA: Diagnosis not present

## 2018-05-23 DIAGNOSIS — N898 Other specified noninflammatory disorders of vagina: Secondary | ICD-10-CM | POA: Diagnosis not present

## 2018-05-23 DIAGNOSIS — Z90710 Acquired absence of both cervix and uterus: Secondary | ICD-10-CM | POA: Diagnosis present

## 2018-05-23 HISTORY — PX: CYSTOSCOPY: SHX5120

## 2018-05-23 HISTORY — PX: ROBOTIC ASSISTED TOTAL HYSTERECTOMY WITH BILATERAL SALPINGO OOPHERECTOMY: SHX6086

## 2018-05-23 LAB — TYPE AND SCREEN
ABO/RH(D): O POS
Antibody Screen: NEGATIVE

## 2018-05-23 SURGERY — HYSTERECTOMY, TOTAL, ROBOT-ASSISTED, LAPAROSCOPIC, WITH BILATERAL SALPINGO-OOPHORECTOMY
Anesthesia: General | Site: Urethra

## 2018-05-23 MED ORDER — LIDOCAINE HCL (CARDIAC) PF 100 MG/5ML IV SOSY
PREFILLED_SYRINGE | INTRAVENOUS | Status: DC | PRN
  Administered 2018-05-23: 80 mg via INTRAVENOUS

## 2018-05-23 MED ORDER — KETOROLAC TROMETHAMINE 30 MG/ML IJ SOLN: 30.0000 mg | Freq: Four times a day (QID) | INTRAMUSCULAR | Status: DC

## 2018-05-23 MED ORDER — KETOROLAC TROMETHAMINE 30 MG/ML IJ SOLN
INTRAMUSCULAR | Status: DC | PRN
  Administered 2018-05-23: 30 mg via INTRAVENOUS

## 2018-05-23 MED ORDER — PANTOPRAZOLE SODIUM 40 MG PO TBEC
DELAYED_RELEASE_TABLET | ORAL | Status: AC
  Filled 2018-05-23: qty 1

## 2018-05-23 MED ORDER — OXYCODONE-ACETAMINOPHEN 5-325 MG PO TABS
1.0000 | ORAL_TABLET | ORAL | Status: DC | PRN
  Administered 2018-05-23: 1 via ORAL

## 2018-05-23 MED ORDER — ONDANSETRON HCL 4 MG/2ML IJ SOLN: 4.0000 mg | Freq: Four times a day (QID) | INTRAMUSCULAR | Status: DC | PRN

## 2018-05-23 MED ORDER — FENTANYL CITRATE (PF) 100 MCG/2ML IJ SOLN
INTRAMUSCULAR | Status: AC
  Filled 2018-05-23: qty 2

## 2018-05-23 MED ORDER — PROPOFOL 10 MG/ML IV BOLUS
INTRAVENOUS | Status: AC
  Filled 2018-05-23: qty 20

## 2018-05-23 MED ORDER — ONDANSETRON HCL 4 MG/2ML IJ SOLN
INTRAMUSCULAR | Status: DC | PRN
  Administered 2018-05-23: 4 mg via INTRAVENOUS

## 2018-05-23 MED ORDER — ROPIVACAINE HCL 5 MG/ML IJ SOLN
INTRAMUSCULAR | Status: AC
  Filled 2018-05-23: qty 60

## 2018-05-23 MED ORDER — PHENYLEPHRINE HCL 10 MG/ML IJ SOLN
INTRAMUSCULAR | Status: DC | PRN
  Administered 2018-05-23 (×3): 100 ug via INTRAVENOUS

## 2018-05-23 MED ORDER — METHYLENE BLUE 0.5 % INJ SOLN
INTRAVENOUS | Status: DC | PRN
  Administered 2018-05-23: 8 mL via INTRAVENOUS

## 2018-05-23 MED ORDER — SODIUM CHLORIDE 0.9 % IV SOLN
2.0000 g | INTRAVENOUS | Status: AC
  Administered 2018-05-23: 2 g via INTRAVENOUS
  Filled 2018-05-23: qty 2

## 2018-05-23 MED ORDER — SCOPOLAMINE 1 MG/3DAYS TD PT72
MEDICATED_PATCH | TRANSDERMAL | Status: AC
  Filled 2018-05-23: qty 1

## 2018-05-23 MED ORDER — KETOROLAC TROMETHAMINE 30 MG/ML IJ SOLN
30.0000 mg | Freq: Four times a day (QID) | INTRAMUSCULAR | Status: DC
  Administered 2018-05-23 – 2018-05-24 (×3): 30 mg via INTRAVENOUS

## 2018-05-23 MED ORDER — ATORVASTATIN CALCIUM 20 MG PO TABS
20.0000 mg | ORAL_TABLET | Freq: Every day | ORAL | Status: DC
  Filled 2018-05-23: qty 1

## 2018-05-23 MED ORDER — OXYCODONE-ACETAMINOPHEN 5-325 MG PO TABS
ORAL_TABLET | ORAL | Status: AC
  Filled 2018-05-23: qty 1

## 2018-05-23 MED ORDER — FENTANYL CITRATE (PF) 250 MCG/5ML IJ SOLN
INTRAMUSCULAR | Status: AC
  Filled 2018-05-23: qty 5

## 2018-05-23 MED ORDER — SENNA 8.6 MG PO TABS
1.0000 | ORAL_TABLET | Freq: Two times a day (BID) | ORAL | Status: DC
  Administered 2018-05-24: 8.6 mg via ORAL
  Filled 2018-05-23: qty 1

## 2018-05-23 MED ORDER — SODIUM CHLORIDE 0.9 % IJ SOLN
INTRAMUSCULAR | Status: AC
  Filled 2018-05-23: qty 50

## 2018-05-23 MED ORDER — ROCURONIUM BROMIDE 100 MG/10ML IV SOLN
INTRAVENOUS | Status: AC
  Filled 2018-05-23: qty 1

## 2018-05-23 MED ORDER — OXYCODONE-ACETAMINOPHEN 5-325 MG PO TABS: 2.0000 | ORAL_TABLET | ORAL | Status: DC | PRN

## 2018-05-23 MED ORDER — KETOROLAC TROMETHAMINE 30 MG/ML IJ SOLN
INTRAMUSCULAR | Status: AC
  Filled 2018-05-23: qty 1

## 2018-05-23 MED ORDER — IBUPROFEN 200 MG PO TABS: 800.0000 mg | ORAL_TABLET | Freq: Three times a day (TID) | ORAL | Status: DC | PRN

## 2018-05-23 MED ORDER — PHENYLEPHRINE 40 MCG/ML (10ML) SYRINGE FOR IV PUSH (FOR BLOOD PRESSURE SUPPORT)
PREFILLED_SYRINGE | INTRAVENOUS | Status: AC
  Filled 2018-05-23: qty 10

## 2018-05-23 MED ORDER — SUGAMMADEX SODIUM 200 MG/2ML IV SOLN
INTRAVENOUS | Status: AC
  Filled 2018-05-23: qty 2

## 2018-05-23 MED ORDER — GABAPENTIN 300 MG PO CAPS
300.0000 mg | ORAL_CAPSULE | Freq: Once | ORAL | Status: AC
  Administered 2018-05-23: 300 mg via ORAL
  Filled 2018-05-23: qty 1

## 2018-05-23 MED ORDER — FENTANYL CITRATE (PF) 100 MCG/2ML IJ SOLN
25.0000 ug | INTRAMUSCULAR | Status: DC | PRN
  Administered 2018-05-23 (×2): 50 ug via INTRAVENOUS

## 2018-05-23 MED ORDER — ONDANSETRON HCL 4 MG/2ML IJ SOLN
INTRAMUSCULAR | Status: AC
  Filled 2018-05-23: qty 2

## 2018-05-23 MED ORDER — SODIUM CHLORIDE 0.9 % IR SOLN
Status: DC | PRN
  Administered 2018-05-23: 1000 mL

## 2018-05-23 MED ORDER — SILVER NITRATE-POT NITRATE 75-25 % EX MISC
CUTANEOUS | Status: AC
  Filled 2018-05-23: qty 3

## 2018-05-23 MED ORDER — TRIAMTERENE-HCTZ 37.5-25 MG PO TABS
1.0000 | ORAL_TABLET | Freq: Every day | ORAL | Status: DC
  Filled 2018-05-23 (×2): qty 1

## 2018-05-23 MED ORDER — LACTATED RINGERS IV SOLN
INTRAVENOUS | Status: DC
  Administered 2018-05-23 – 2018-05-24 (×2): via INTRAVENOUS

## 2018-05-23 MED ORDER — TRIAMTERENE-HCTZ 37.5-25 MG PO TABS: 1.0000 | ORAL_TABLET | Freq: Every day | ORAL | Status: DC

## 2018-05-23 MED ORDER — STERILE WATER FOR IRRIGATION IR SOLN
Status: DC | PRN
  Administered 2018-05-23: 1000 mL

## 2018-05-23 MED ORDER — LIDOCAINE HCL (CARDIAC) PF 100 MG/5ML IV SOSY
PREFILLED_SYRINGE | INTRAVENOUS | Status: AC
  Filled 2018-05-23: qty 5

## 2018-05-23 MED ORDER — LACTATED RINGERS IV SOLN
INTRAVENOUS | Status: DC
  Administered 2018-05-23 (×2): via INTRAVENOUS

## 2018-05-23 MED ORDER — MIDAZOLAM HCL 2 MG/2ML IJ SOLN
INTRAMUSCULAR | Status: AC
  Filled 2018-05-23: qty 2

## 2018-05-23 MED ORDER — SCOPOLAMINE 1 MG/3DAYS TD PT72
1.0000 | MEDICATED_PATCH | TRANSDERMAL | Status: DC
  Administered 2018-05-23: 1.5 mg via TRANSDERMAL

## 2018-05-23 MED ORDER — MIDAZOLAM HCL 5 MG/5ML IJ SOLN
INTRAMUSCULAR | Status: DC | PRN
  Administered 2018-05-23: 2 mg via INTRAVENOUS

## 2018-05-23 MED ORDER — MENTHOL 3 MG MT LOZG
LOZENGE | OROMUCOSAL | Status: AC
  Filled 2018-05-23: qty 9

## 2018-05-23 MED ORDER — LEVOTHYROXINE SODIUM 150 MCG PO TABS
150.0000 ug | ORAL_TABLET | Freq: Every day | ORAL | Status: DC
  Filled 2018-05-23: qty 1

## 2018-05-23 MED ORDER — ALUM & MAG HYDROXIDE-SIMETH 200-200-20 MG/5ML PO SUSP
30.0000 mL | ORAL | Status: DC | PRN
  Filled 2018-05-23: qty 30

## 2018-05-23 MED ORDER — TEMAZEPAM 15 MG PO CAPS: 15.0000 mg | ORAL_CAPSULE | Freq: Every evening | ORAL | Status: DC | PRN

## 2018-05-23 MED ORDER — ONDANSETRON HCL 4 MG PO TABS
4.0000 mg | ORAL_TABLET | Freq: Four times a day (QID) | ORAL | Status: DC | PRN
  Filled 2018-05-23: qty 1

## 2018-05-23 MED ORDER — SODIUM CHLORIDE 0.9 % IJ SOLN
INTRAMUSCULAR | Status: AC
  Filled 2018-05-23: qty 10

## 2018-05-23 MED ORDER — FENTANYL CITRATE (PF) 100 MCG/2ML IJ SOLN
INTRAMUSCULAR | Status: DC | PRN
  Administered 2018-05-23 (×2): 100 ug via INTRAVENOUS
  Administered 2018-05-23: 50 ug via INTRAVENOUS
  Administered 2018-05-23: 100 ug via INTRAVENOUS

## 2018-05-23 MED ORDER — SIMETHICONE 80 MG PO CHEW
80.0000 mg | CHEWABLE_TABLET | Freq: Four times a day (QID) | ORAL | Status: DC | PRN
  Filled 2018-05-23: qty 1

## 2018-05-23 MED ORDER — DEXAMETHASONE SODIUM PHOSPHATE 10 MG/ML IJ SOLN
INTRAMUSCULAR | Status: AC
  Filled 2018-05-23: qty 1

## 2018-05-23 MED ORDER — DEXAMETHASONE SODIUM PHOSPHATE 10 MG/ML IJ SOLN
INTRAMUSCULAR | Status: DC | PRN
  Administered 2018-05-23: 8 mg via INTRAVENOUS

## 2018-05-23 MED ORDER — SUGAMMADEX SODIUM 200 MG/2ML IV SOLN
INTRAVENOUS | Status: DC | PRN
  Administered 2018-05-23: 200 mg via INTRAVENOUS

## 2018-05-23 MED ORDER — ARTIFICIAL TEARS OPHTHALMIC OINT
TOPICAL_OINTMENT | OPHTHALMIC | Status: AC
  Filled 2018-05-23: qty 3.5

## 2018-05-23 MED ORDER — MENTHOL 3 MG MT LOZG
1.0000 | LOZENGE | OROMUCOSAL | Status: DC | PRN
  Administered 2018-05-23: 3 mg via ORAL
  Filled 2018-05-23: qty 9

## 2018-05-23 MED ORDER — ROCURONIUM BROMIDE 100 MG/10ML IV SOLN
INTRAVENOUS | Status: DC | PRN
  Administered 2018-05-23 (×2): 20 mg via INTRAVENOUS
  Administered 2018-05-23: 50 mg via INTRAVENOUS
  Administered 2018-05-23: 10 mg via INTRAVENOUS

## 2018-05-23 MED ORDER — ACETAMINOPHEN 500 MG PO TABS
1000.0000 mg | ORAL_TABLET | Freq: Once | ORAL | Status: AC
  Administered 2018-05-23: 1000 mg via ORAL
  Filled 2018-05-23: qty 2

## 2018-05-23 MED ORDER — METHYLENE BLUE 0.5 % INJ SOLN
INTRAVENOUS | Status: AC
  Filled 2018-05-23: qty 10

## 2018-05-23 MED ORDER — PANTOPRAZOLE SODIUM 40 MG PO TBEC
40.0000 mg | DELAYED_RELEASE_TABLET | Freq: Every day | ORAL | Status: DC
  Administered 2018-05-23: 40 mg via ORAL
  Filled 2018-05-23 (×2): qty 1

## 2018-05-23 MED ORDER — PROPOFOL 10 MG/ML IV BOLUS
INTRAVENOUS | Status: DC | PRN
  Administered 2018-05-23: 150 mg via INTRAVENOUS

## 2018-05-23 MED ORDER — HYDROMORPHONE HCL 1 MG/ML IJ SOLN: 0.2000 mg | INTRAMUSCULAR | Status: DC | PRN

## 2018-05-23 MED ORDER — PROMETHAZINE HCL 25 MG/ML IJ SOLN: 6.2500 mg | INTRAMUSCULAR | Status: DC | PRN

## 2018-05-23 MED ORDER — SODIUM CHLORIDE 0.9 % IV SOLN
INTRAVENOUS | Status: DC | PRN
  Administered 2018-05-23: 110 mL
  Administered 2018-05-23: 10 mL

## 2018-05-23 SURGICAL SUPPLY — 61 items
ADH SKN CLS APL DERMABOND .7 (GAUZE/BANDAGES/DRESSINGS) ×2
APL SRG 38 LTWT LNG FL B (MISCELLANEOUS) ×2
APPLICATOR ARISTA FLEXITIP XL (MISCELLANEOUS) ×2 IMPLANT
BARRIER ADHS 3X4 INTERCEED (GAUZE/BANDAGES/DRESSINGS) IMPLANT
BRR ADH 4X3 ABS CNTRL BYND (GAUZE/BANDAGES/DRESSINGS)
CATH FOLEY 3WAY  5CC 16FR (CATHETERS) ×2
CATH FOLEY 3WAY 5CC 16FR (CATHETERS) ×2 IMPLANT
CONT PATH 16OZ SNAP LID 3702 (MISCELLANEOUS) ×2 IMPLANT
COVER BACK TABLE 60X90IN (DRAPES) ×4 IMPLANT
COVER TIP SHEARS 8 DVNC (MISCELLANEOUS) ×2 IMPLANT
COVER TIP SHEARS 8MM DA VINCI (MISCELLANEOUS) ×2
COVER WAND RF STERILE (DRAPES) ×2 IMPLANT
DECANTER SPIKE VIAL GLASS SM (MISCELLANEOUS) ×8 IMPLANT
DEFOGGER SCOPE WARMER CLEARIFY (MISCELLANEOUS) ×4 IMPLANT
DERMABOND ADVANCED (GAUZE/BANDAGES/DRESSINGS) ×2
DERMABOND ADVANCED .7 DNX12 (GAUZE/BANDAGES/DRESSINGS) ×2 IMPLANT
DILATOR CANAL MILEX (MISCELLANEOUS) ×4 IMPLANT
DRAPE ARM DVNC X/XI (DISPOSABLE) ×8 IMPLANT
DRAPE COLUMN DVNC XI (DISPOSABLE) ×2 IMPLANT
DRAPE DA VINCI XI ARM (DISPOSABLE) ×8
DRAPE DA VINCI XI COLUMN (DISPOSABLE) ×2
DURAPREP 26ML APPLICATOR (WOUND CARE) ×4 IMPLANT
ELECT REM PT RETURN 15FT ADLT (MISCELLANEOUS) ×2 IMPLANT
GLOVE BIOGEL M 6.5 STRL (GLOVE) ×12 IMPLANT
GLOVE BIOGEL PI IND STRL 7.0 (GLOVE) ×10 IMPLANT
GLOVE BIOGEL PI INDICATOR 7.0 (GLOVE) ×10
HEMOSTAT ARISTA ABSORB 3G PWDR (MISCELLANEOUS) ×2 IMPLANT
IRRIG SUCT STRYKERFLOW 2 WTIP (MISCELLANEOUS) ×4
IRRIGATION SUCT STRKRFLW 2 WTP (MISCELLANEOUS) ×2 IMPLANT
LEGGING LITHOTOMY PAIR STRL (DRAPES) ×4 IMPLANT
OBTURATOR OPTICAL STANDARD 8MM (TROCAR) ×2
OBTURATOR OPTICAL STND 8 DVNC (TROCAR) ×2
OBTURATOR OPTICALSTD 8 DVNC (TROCAR) IMPLANT
OCCLUDER COLPOPNEUMO (BALLOONS) ×4 IMPLANT
PACK ROBOT WH (CUSTOM PROCEDURE TRAY) ×4 IMPLANT
PACK ROBOTIC GOWN (GOWN DISPOSABLE) ×4 IMPLANT
PACK TRENDGUARD 450 HYBRID PRO (MISCELLANEOUS) IMPLANT
PAD PREP 24X48 CUFFED NSTRL (MISCELLANEOUS) ×4 IMPLANT
POSITIONER SURGICAL ARM (MISCELLANEOUS) ×4 IMPLANT
SEAL CANN UNIV 5-8 DVNC XI (MISCELLANEOUS) ×6 IMPLANT
SEAL XI 5MM-8MM UNIVERSAL (MISCELLANEOUS) ×8
SEALER VESSEL DA VINCI XI (MISCELLANEOUS) ×2
SEALER VESSEL EXT DVNC XI (MISCELLANEOUS) IMPLANT
SET CYSTO W/LG BORE CLAMP LF (SET/KITS/TRAYS/PACK) ×2 IMPLANT
SET TRI-LUMEN FLTR TB AIRSEAL (TUBING) IMPLANT
SUT VIC AB 0 CT1 27 (SUTURE) ×8
SUT VIC AB 0 CT1 27XBRD ANBCTR (SUTURE) ×4 IMPLANT
SUT VICRYL 0 UR6 27IN ABS (SUTURE) IMPLANT
SUT VICRYL RAPIDE 4/0 PS 2 (SUTURE) ×14 IMPLANT
SUT VLOC 180 0 9IN  GS21 (SUTURE) ×2
SUT VLOC 180 0 9IN GS21 (SUTURE) ×2 IMPLANT
TIP RUMI ORANGE 6.7MMX12CM (TIP) IMPLANT
TIP UTERINE 5.1X6CM LAV DISP (MISCELLANEOUS) IMPLANT
TIP UTERINE 6.7X10CM GRN DISP (MISCELLANEOUS) IMPLANT
TIP UTERINE 6.7X6CM WHT DISP (MISCELLANEOUS) IMPLANT
TIP UTERINE 6.7X8CM BLUE DISP (MISCELLANEOUS) ×2 IMPLANT
TOWEL OR 17X26 10 PK STRL BLUE (TOWEL DISPOSABLE) ×8 IMPLANT
TRENDGUARD 450 HYBRID PRO PACK (MISCELLANEOUS) ×4
TROCAR BLADELESS OPT 5 100 (ENDOMECHANICALS) ×4 IMPLANT
TROCAR PORT AIRSEAL 8X120 (TROCAR) ×4 IMPLANT
WATER STERILE IRR 1000ML POUR (IV SOLUTION) ×4 IMPLANT

## 2018-05-23 NOTE — Op Note (Signed)
05/23/2018  12:47 PM  PATIENT:  Courtney Collins  57 y.o. female  PRE-OPERATIVE DIAGNOSIS:  D25.0 Fibroids, submucosal N93.9 Abnormal uterine bleeding  POST-OPERATIVE DIAGNOSIS:  Fibroids, submucosal, Abnormal uterine bleeding  PROCEDURE:  Procedure(s): XI ROBOTIC ASSISTED TOTAL HYSTERECTOMY WITH BILATERAL SALPINGO OOPHORECTOMY (Bilateral) CYSTOSCOPY (N/A)  SURGEON:  Surgeon(s) and Role:    Christophe Louis, MD - Primary    * Thurnell Lose, MD - Assisting  PHYSICIAN ASSISTANT: None  ASSISTANTS: Dr. Thurnell Lose assisted due to the complexity of the surgery    ANESTHESIA:   general  EBL:  50 mL   BLOOD ADMINISTERED:none  DRAINS: Urinary Catheter (Foley)   LOCAL MEDICATIONS USED:  OTHER ropivacaine  SPECIMEN:  Source of Specimen:  Vaginal Polyp / uterus/ cervix/ bilateral fallopian tubes and ovaries.   DISPOSITION OF SPECIMEN:  PATHOLOGY  COUNTS:  YES  TOURNIQUET:  * No tourniquets in log *  DICTATION: .Dragon Dictation  PLAN OF CARE: Admit for overnight observation  PATIENT DISPOSITION:  PACU - hemodynamically stable.   Delay start of Pharmacological VTE agent (>24 hrs) due to surgical blood loss or risk of bleeding: not applicable  Findings: Normal appearing cervix. 1 cm anterior vaginal polyp...enlarged multiple fibroid uterus... Normal appearing fallopian tubes and ovaries.   Procedure: The patient was taken to the operating room where she was placed under general anesthesia.Time out was performed. Marland Kitchen She was placed in dorsal lithotomy position and prepped and draped in the usual sterile fashion. A weighted speculum was placed into the vagina. A Deaver was placed anteriorly for retraction. The anterior lip of the cervix was grasped with a single-tooth tenaculum. The vaginal mucosa was injected with 2.5 cc of ropivacaine at the 2/4/ 8 and 10 o'clock positions. The uterus was sounded to 9 cm. the cervix was dilated to 6 mm . 0 vicryl suture placed at the 12 and 6:00  positions Of the cervix to facilitate placement of a Ru mi uterine manipulator. The manipulator was placed without difficulty. Weighted speculum and Deaver were removed .  Foley catheter was placed.   The anterior vaginal polyp was excised with mayo scissors. The vaginal mucosa was re approximated with 4-0 vicryl.   Attention was turned to the patient's abdomen where a 8 mm trocar was placed at the umbilicus.under direct visualization . The pneumoperitoneum was achieved with PCO2 gas. The laparoscope was removed. 60 cc of ropivacaine were injected into the abdominal cavity. The laparoscope was reinserted. An 8 mm trocar was placed in the right upper quadrant 16 centimeters from the umbilicus.later connected to robotic arm #4). An 8MM incision was made in the Right upper quadrant TROCAR WAS PLACED 8 cm from the umbilicus. Later connected to robotic arm #3. An 8 mm incision was made in the left upper quadrant 16 cm from the umbilicus and connected to robot arm #1. Marland Kitchen Attention was turned to the left upper quadrant where a 8 mm midclavicular assistant trocar was placed. ( All incision sites were injected with 10 cc of ropivacaine prior to port placement. )  Once all ports had been placed under direct visualization.The laparoscope was removed and the [employer] robotic system was then right-sided docked. The robotic arms were connected to the corresponding trocars as listed above. The laparoscope was then reinserted. The long tip bipolar forceps were placed into port #1. The monopolar scissor placed in the port #4. A vessel sealer was placed in port #3. All instruments were directed into the pelvis under direct visualization.  Attention was turned to the surgeons console.. It was noted that the umbilical camera trocar was to close to the anatomy to allow for adequate visualization. The DaVinci robot was undocked. The umbilical trocar was removed.  The skin incision was closed with 4-0 vicryl. . An 8MM incision was  made 4 cm above the umbilicus. The robotic camera trocar was placed under visualization.   The DaVinci robot was re docked and the instruments were reinserted. The left infundibulopelvic-pelvic ligament was cauterized and transected with the vessel sealer The broad ligament was cauterized and transected with the vessel sealer .The round ligament was cauterized and transected with the vessel sealer  The anterior leaf of broad ligament was incised along the bladder reflection to the midline.  The right  Infundibulopelvic  ligament was cauterized and transected with the vessel sealer. The right broad ligament was cauterized and transected with the vessel sealer. The right round ligament was cauterized and transected with the vessel sealer The broad ligament was incised to the midline. The bladder was dissected off the lower uterine segments of the cervix via sharp and blunt dissection.   The uterine arteries were skeleton bilaterally. They were  cauterized and transected with the vessel sealer The KOH ring was identified. The anterior colpotomy  was performed followed by the posterior colpotomy using laparoscopic scissor. . Once the uterus,cervix and bilateral fallopian tubes were completely excised was removed through the vagina. It was necessary to morcellate the uterus vaginal to facilitate removal.    The  bipolar forceps and scissors were removed and log tip forceps were placed in the port #1 and the cutting needle driver was placed in to port #3.  The vaginal cuff was closed with running suture if 0 v-lock. The pelvis was irrigated. Marland KitchenMarland KitchenMarland KitchenExcellent hemostasis was noted. Arista was placed along the vaginal cuff.  All pelvic pedicles were examined and hemostasis was noted.  All instruments removed from the ports. All ports were removed under direct Visualization. The pneumoperitoneum was released. The skin incisions were closed with 4-0 Vicryl and then covered with Derma bond.    The foley catheter was  removed. Cystoscopy was performed using 30 degree scope. Methylene blue was given IV by anesthesia. Both ureteral orifices were noted to express methylene blue. The cystoscope was removed. Foley catheter was replaced.     Sponge lap and needle counts were correct x. The patient was awakened from anesthesia and taken to the recovery room in stable condition.

## 2018-05-23 NOTE — H&P (Signed)
Chief Complaint(s):   Abnormal Uterine bleeding and fibroids   HPI:  General 57 y/o presents for preop history and physical exam in preparation for robotic assisted hysterectomy with bilateral salpinoophorectomy scheduled for 05/23/2018. she has fibroids and menorrhagia. she is a patient of Dr. Grier Rocher. . she changing 2 pads every hour. she reports hygiene accidents. Ultrasound performed. 02/27/2018 reveals a 12 cm x 8 cm x 9 cm uterus. the endometrium is 9.5 mm. Multiple fibroids are seen . the largest is midbody submucosal fiborid 6.5 cm. Her ovaries appear normal bilaterally.  Endometrial biopsy June 2019 was benign. Current Medication: Taking  Triamterene-HCTZ 37.5-25 MG Tablet 1 tablet Orally Once a day     Atorvastatin Calcium 20 mg Tablet take 1 tablet by mouth once daily by mouth Once a day     Levothyroxine Sodium 150 MCG Tablet 1 tablet every morning on an empty stomach by mouth Once a day   Not-Taking  Flexeril(Cyclobenzaprine HCl) 10 MG Tablet 1 tablet Orally daily     Provera(medroxyPROGESTERone) 10 MG Tablet 1 table Orally One tablet daiy     Medication List reviewed and reconciled with the patient   Medical History:  hypertension-PCP Dr. 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   Gyn History:  Sexual activity not currently sexually active. Periods : irregular. LMP 02/18/2018 started spotting and still is spotting. Last pap smear date 01/31/2018 Neg/HPVneg. Last mammogram date 01/23/2013-normal. Denies STD.   OB History:  Number of pregnancies 1. Pregnancy # 1 vaginal delivery, girl, live birth.   Surgical History:  Colonoscopy 01/2013     right rotator cuff- Dr Gladstone Lighter 02/2016   Hospitalization:  No Hospitalization History.   Family History:  Father: deceased    Mother: alive, hypertension, cholesterol, stomach cancer    4 brother(s) , 5  sister(s) . 1daughter(s) .    Denies family Hx colon cancer, colon polyps or liver disease, denies any GYN family cancer hx.  Social History: General no EXPOSURE TO PASSIVE SMOKE.  Alcohol: no.  Tobacco use cigarettes: Never smoked, Tobacco history last updated 05/16/2018.  Recreational drug use: no.  OCCUPATION: employed, Brewing technologist OR.  Exercise: nothing structured.   ROS: CONSTITUTIONAL No" options="no,yes" propid="91" itemid="193425" categoryid="10464" encounterid="[id number]"Chills No. No" options="no,yes" propid="91" itemid="172899" categoryid="10464" encounterid="[id number]"Fatigue No. No" options="no,yes" propid="91" itemid="10467" categoryid="10464" encounterid="[id number]"Fever No. No" options="no,yes" propid="91" itemid="193426" categoryid="10464" encounterid="[id number]"Night sweats No. No" options="no,yes" propid="91" itemid="444261" categoryid="10464" encounterid="[id number]"Recent travel outside [place] No. No" options="no,yes" propid="91" itemid="193427" categoryid="10464" encounterid="[id number]"Sweats No. No" options="no,yes" propid="91" itemid="194825" categoryid="10464" encounterid="[id number]"Weight change No.  OPHTHALMOLOGY no" options="no,yes" propid="91" itemid="12520" categoryid="12516" encounterid="[id number]"Blurring of vision no. no" options="no,yes" propid="91" itemid="193469" categoryid="12516" encounterid="[id number]"Change in vision no. no" options="no,yes" propid="91" itemid="194379" categoryid="12516" encounterid="[id number]"Double vision no.  ENT no" options="no,yes" propid="91" itemid="193612" categoryid="10481" encounterid="[id number]"Dizziness no. Nose bleeds no. Sore throat no. Teeth pain no.  ALLERGY no" options="no,yes" propid="91" itemid="202589" categoryid="138152" encounterid="[id number]"Hives no.  CARDIOLOGY no" options="no,yes" propid="91" itemid="193603" categoryid="10488" encounterid="[id number]"Chest pain no. YES" options="no,yes" propid="91" itemid="199089"  categoryid="10488" encounterid="[id number]"High blood pressure YES. no" options="no,yes" propid="91" itemid="202598" categoryid="10488" encounterid="[id number]"Irregular heart beat no. bilateral" options="no,yes" propid="91" itemid="10491" categoryid="10488" encounterid="[id number]"Leg edema bilateral. no" options="no,yes" propid="91" itemid="10490" categoryid="10488" encounterid="[id number]"Palpitations no.  RESPIRATORY no" options="no" propid="91" itemid="270013" categoryid="138132" encounterid="[id number]"Shortness of breath no. no" options="no,yes" propid="91" itemid="172745" categoryid="138132" encounterid="[id number]"Cough no. no" options="no,yes" propid="91" itemid="193621" categoryid="138132" encounterid="[id number]"Wheezing no.  UROLOGY no" options="no,yes" propid="91" itemid="194377" categoryid="138166" encounterid="[id number]"Pain with urination no. no"  options="no,yes" propid="91" [phone]" categoryid="138166" encounterid="[id number]"Urinary urgency no. no" options="no,yes" propid="91" itemid="193492" categoryid="138166" encounterid="[id number]"Urinary frequency no. no" options="no,yes" propid="91" itemid="138171" categoryid="138166" encounterid="[id number]"Urinary incontinence no. No" options="no,yes" propid="91" itemid="138167" categoryid="138166" encounterid="[id number]"Difficulty urinating No. No" options="no,yes" propid="91" itemid="138168" categoryid="138166" encounterid="[id number]"Blood in urine No.  GASTROENTEROLOGY no" options="no,yes" propid="91" itemid="10496" categoryid="10494" encounterid="[id number]"Abdominal pain no. no" options="no,yes" propid="91" itemid="193447" categoryid="10494" encounterid="[id number]"Appetite change no. no" options="no,yes" propid="91" itemid="193448" categoryid="10494" encounterid="[id number]"Bloating/belching no. no" options="no,yes" propid="91" itemid="10503" categoryid="10494" encounterid="[id number]"Blood in stool or on toilet paper no. no" options="no,yes" propid="91"  itemid="199106" categoryid="10494" encounterid="[id number]"Change in bowel movements no. no" options="no,yes" propid="91" itemid="10501" categoryid="10494" encounterid="[id number]"Constipation no. no" options="no,yes" propid="91" itemid="10502" categoryid="10494" encounterid="[id number]"Diarrhea no. no" options="no,yes" propid="91" itemid="199104" categoryid="10494" encounterid="[id number]"Difficulty swallowing no. no" options="no,yes" propid="91" itemid="10499" categoryid="10494" encounterid="[id number]"Nausea no.  FEMALE REPRODUCTIVE no" options="no,yes" propid="91" itemid="453725" categoryid="10525" encounterid="[id number]"Vulvar pain no. no" options="no,yes" propid="91" itemid="453726" categoryid="10525" encounterid="[id number]"Vulvar rash no. yes" options="no, yes" propid="91" itemid="444315" categoryid="10525" encounterid="[id number]"Abnormal vaginal bleeding yes. no" options="no,yes" propid="91" itemid="186083" categoryid="10525" encounterid="[id number]"Breast pain no. no" options="no,yes" propid="91" itemid="186084" categoryid="10525" encounterid="[id number]"Nipple discharge no. no" options="no,yes" propid="91" itemid="275823" categoryid="10525" encounterid="[id number]"Pain with intercourse no. no" options="no,yes" propid="91" itemid="186082" categoryid="10525" encounterid="[id number]"Pelvic pain no. no" options="no,yes" propid="91" itemid="278230" categoryid="10525" encounterid="[id number]"Unusual vaginal discharge no. no" options="no,yes" propid="91" itemid="278942" categoryid="10525" encounterid="[id number]"Vaginal itching no.  MUSCULOSKELETAL no" options="no,yes" propid="91" itemid="193461" categoryid="10514" encounterid="[id number]"Muscle aches no.  NEUROLOGY no" options="no,yes" propid="91" itemid="12513" categoryid="12512" encounterid="[id number]"Headache no. no" options="no,yes" propid="91" itemid="12514" categoryid="12512" encounterid="[id number]"Tingling/numbness no. no" options="no,yes" propid="91" itemid="193468"  categoryid="12512" encounterid="[id number]"Weakness no.  PSYCHOLOGY no" options="" propid="91" itemid="275919" categoryid="10520" encounterid="[id number]"Depression no. no" options="no,yes" propid="91" itemid="172748" categoryid="10520" encounterid="[id number]"Anxiety no. no" options="no,yes" propid="91" itemid="199158" categoryid="10520" encounterid="[id number]"Nervousness no. no" options="no,yes" propid="91" itemid="12502" categoryid="10520" encounterid="[id number]"Sleep disturbances no. no " options="no,yes" propid="91" itemid="72718" categoryid="10520" encounterid="10829998"Suicidal ideation no .  ENDOCRINOLOGY no" options="no,yes" propid="91" itemid="194628" categoryid="12508" encounterid="[id number]"Excessive thirst no. no" options="no,yes" propid="91" itemid="196285" categoryid="12508" encounterid="10829998"Excessive urination no. no" options="no, yes" propid="91" itemid="444314" categoryid="12508" encounterid="[id number]"Hair loss no. no" options="" propid="91" itemid="447284" categoryid="12508" encounterid="[id number]"Heat or cold intolerance no.  HEMATOLOGY/LYMPH no" options="no,yes" propid="91" itemid="199152" categoryid="138157" encounterid="10829998"Abnormal bleeding no. no" options="no,yes" propid="91" itemid="170653" categoryid="138157" encounterid="10829998"Easy bruising no. no" options="no,yes" propid="91" itemid="138158" categoryid="138157" encounterid="10829998"Swollen glands no.  DERMATOLOGY no" options="no,yes" propid="91" itemid="199126" categoryid="12503" encounterid="[id number]"New/changing skin lesion no. no" options="no,yes" propid="91" itemid="12504" categoryid="12503" encounterid="[id number]"Rash no. no" options="" propid="91" itemid="444313" categoryid="12503" encounterid="[id number]"Sores no.   Negative except as stated in HPI.  Objective: Vitals: Wt 232, Wt change 2 lb, Ht 63.5, BMI 40.45, Pulse sitting 85, BP sitting 128/80  Past Results: Examination:  General Examination alert, oriented,  NAD " categoryPropId="10089" examid="193638"CONSTITUTIONAL: alert, oriented, NAD .  moist, warm" categoryPropId="10109" examid="193638"SKIN: moist, warm.  Conjunctiva clear" categoryPropId="21468" examid="193638"EYES: Conjunctiva clear.  clear to auscultation bilaterally" categoryPropId="87" examid="193638"LUNGS: clear to auscultation bilaterally.  regular rate and rhythm" categoryPropId="86" examid="193638"HEART: regular rate and rhythm.  soft, non-tender/non-distended, bowel sounds present " categoryPropId="88" examid="193638"ABDOMEN: soft, non-tender/non-distended, bowel sounds present .  normal external genitalia,labia - unremarkable,vagina - pink moist mucosa, no lesions or abnormal discharge,cervix - no discharge or lesions or CMT,adnexa - no masses or tenderness,uterus - nontender 12 wk size uterus" categoryPropId="13414" examid="193638"FEMALE GENITOURINARY: normal external genitalia,labia - unremarkable,vagina - pink moist mucosa, no lesions or abnormal discharge,cervix - no discharge or lesions or CMT,adnexa - no masses or tenderness,uterus - nontender 12 wk size uterus.  affect normal, good eye contact" categoryPropId="16316" examid="193638"PSYCH: affect normal, good eye contact.  Physical Examination: Chaperone present for pelvic exam, Chapman,Courtney 05/16/2018 11:37:59 AM &gt; "Chaperone present for pelvic exam, Chapman,Courtney 05/16/2018 11:37:59 AM > .     Assessment: Assessment:  Fibroids - D21.9 (Primary)     Abnormal uterine bleeding (AUB) - N93.9     Plan: Treatment: Fibroids Notes: patient desires definitive therapy via a hysterectomy.. planning robotic assisted  laparoscopic hysterectomy with bilateral salpingooophorectomy. . I discussed with the pt r/b/a of hysterectomy including but not limited to infection/ bleeding damage to bowel bladder ureters and surrounding organs with the need for further surgery. r/o conversion from laparoscopic hysterectomy to total abdominal  hysterectomy discussed. ... r/o transfustion hiv/ hep B&C discussed.. pt voiced understanding and desires to proceed.. Abnormal uterine bleeding (AUB) Notes: patient desires definitive therapy via a hysterectomy.. planning robotic assisted laparoscopic hysterectomy with bilateral salpingooophorectomy. . I discussed with the pt r/b/a of hysterectomy including but not limited to infection/ bleeding damage to bowel bladder ureters and surrounding organs with the need for further surgery. r/o conversion from laparoscopic hysterectomy to total abdominal hysterectomy discussed. ... r/o transfustion hiv/ hep B&C discussed.. pt voiced understanding and desires to proceed.Marland Kitchen

## 2018-05-23 NOTE — H&P (Signed)
Date of Initial H&P: 05/23/2018 History reviewed, patient examined, no change in status, stable for surgery.

## 2018-05-23 NOTE — Anesthesia Procedure Notes (Signed)
Procedure Name: Intubation Date/Time: 05/23/2018 8:37 AM Performed by: Jonna Munro, CRNA Pre-anesthesia Checklist: Patient identified, Emergency Drugs available, Suction available, Patient being monitored and Timeout performed Patient Re-evaluated:Patient Re-evaluated prior to induction Oxygen Delivery Method: Circle system utilized Preoxygenation: Pre-oxygenation with 100% oxygen Induction Type: IV induction Ventilation: Mask ventilation without difficulty Laryngoscope Size: Mac and 3 Grade View: Grade I Tube type: Oral Tube size: 7.0 mm Number of attempts: 1 Airway Equipment and Method: Stylet Placement Confirmation: ETT inserted through vocal cords under direct vision,  positive ETCO2 and breath sounds checked- equal and bilateral Secured at: 22 cm Tube secured with: Tape Dental Injury: Teeth and Oropharynx as per pre-operative assessment

## 2018-05-23 NOTE — Transfer of Care (Signed)
Immediate Anesthesia Transfer of Care Note  Patient: Courtney Collins  Procedure(s) Performed: Procedure(s) (LRB): XI ROBOTIC ASSISTED TOTAL HYSTERECTOMY WITH BILATERAL SALPINGO OOPHORECTOMY (Bilateral) CYSTOSCOPY (N/A)  Patient Location: PACU  Anesthesia Type: General  Level of Consciousness: awake, sedated, patient cooperative and responds to stimulation  Airway & Oxygen Therapy: Patient Spontanous Breathing and Patient connected to face mask oxygen  Post-op Assessment: Report given to PACU RN, Post -op Vital signs reviewed and stable and Patient moving all extremities  Post vital signs: Reviewed and stable  Complications: No apparent anesthesia complications

## 2018-05-23 NOTE — Anesthesia Postprocedure Evaluation (Signed)
Anesthesia Post Note  Patient: Courtney Collins  Procedure(s) Performed: XI ROBOTIC ASSISTED TOTAL HYSTERECTOMY WITH BILATERAL SALPINGO OOPHORECTOMY (Bilateral Abdomen) CYSTOSCOPY (N/A Urethra)     Patient location during evaluation: PACU Anesthesia Type: General Level of consciousness: awake and alert, awake and oriented Pain management: pain level controlled Vital Signs Assessment: post-procedure vital signs reviewed and stable Respiratory status: spontaneous breathing, nonlabored ventilation and respiratory function stable Cardiovascular status: blood pressure returned to baseline and stable Postop Assessment: no apparent nausea or vomiting Anesthetic complications: no    Last Vitals:  Vitals:   05/23/18 1345 05/23/18 1400  BP: 122/88 132/89  Pulse: 66 63  Resp: 14 12  Temp: 36.7 C   SpO2: 97% 99%    Last Pain:  Vitals:   05/23/18 1345  TempSrc:   PainSc: Asleep                 Catalina Gravel

## 2018-05-24 ENCOUNTER — Encounter (HOSPITAL_COMMUNITY): Payer: Self-pay | Admitting: Obstetrics and Gynecology

## 2018-05-24 DIAGNOSIS — D250 Submucous leiomyoma of uterus: Secondary | ICD-10-CM | POA: Diagnosis not present

## 2018-05-24 LAB — CBC
HEMATOCRIT: 36.6 % (ref 36.0–46.0)
Hemoglobin: 11.4 g/dL — ABNORMAL LOW (ref 12.0–15.0)
MCH: 26.6 pg (ref 26.0–34.0)
MCHC: 31.1 g/dL (ref 30.0–36.0)
MCV: 85.5 fL (ref 80.0–100.0)
PLATELETS: 367 10*3/uL (ref 150–400)
RBC: 4.28 MIL/uL (ref 3.87–5.11)
RDW: 14.4 % (ref 11.5–15.5)
WBC: 13.6 10*3/uL — ABNORMAL HIGH (ref 4.0–10.5)
nRBC: 0 % (ref 0.0–0.2)

## 2018-05-24 MED ORDER — OXYCODONE-ACETAMINOPHEN 5-325 MG PO TABS: 1.0000 | ORAL_TABLET | Freq: Four times a day (QID) | ORAL | 0 refills | Status: DC | PRN

## 2018-05-24 MED ORDER — KETOROLAC TROMETHAMINE 30 MG/ML IJ SOLN
INTRAMUSCULAR | Status: AC
  Filled 2018-05-24: qty 1

## 2018-05-24 MED ORDER — IBUPROFEN 800 MG PO TABS: 800.0000 mg | ORAL_TABLET | Freq: Three times a day (TID) | ORAL | 0 refills | Status: DC | PRN

## 2018-05-24 MED FILL — IBUPROFEN 800 MG TAB: 800 | 10 days supply | Qty: 30 | Fill #0

## 2018-05-24 MED FILL — OXYCODONE-ACETAMINOPHEN 5-3: 5-325 | 3 days supply | Qty: 30 | Fill #0

## 2018-05-24 NOTE — Discharge Summary (Signed)
Physician Discharge Summary  Patient ID: Courtney Collins MRN: [id number] DOB/AGE: [birth year] 59 y.o.  Admit date: 05/23/2018 Discharge date: 05/24/2018  Admission Diagnoses:1) abnormal uterine bleeding 2) uterine fibroids   Discharge Diagnoses: Same plus Vaginal poly p Active Problems:   S/P hysterectomy   Discharged Condition: stable  Hospital Course: Pt was admitted for observation after undergoing robotic assisted laparoscopic hysterectomy with bilateral salpingoophorectomy. And removal of vaginal polyp. She did well postoperatively with pain well controlled and return of bladder function   Consults: None  Significant Diagnostic Studies: labs: HGB pod #1 11.4   Treatments: surgery: robotic assisted hysterectomy with bilateral salpingoophorectomy    Discharge Exam: Blood pressure 109/74, pulse 69, temperature 97.7 F (36.5 C), resp. rate 16, height 5\' 3"  (1.6 m), weight 103.7 kg, SpO2 98 %. General appearance: alert, cooperative and no distress GI: soft nontender nondistended ... incisions healing well without erythema or exudate  Extremities: extremities normal, atraumatic, no cyanosis or edema  Disposition: Discharge disposition: 01-Home or Self Care       Discharge Instructions    Call MD for:  persistant nausea and vomiting   Complete by:  As directed    Call MD for:  redness, tenderness, or signs of infection (pain, swelling, redness, odor or green/yellow discharge around incision site)   Complete by:  As directed    Call MD for:  severe uncontrolled pain   Complete by:  As directed    Call MD for:  temperature >100.4   Complete by:  As directed    Diet - low sodium heart healthy   Complete by:  As directed    Driving Restrictions   Complete by:  As directed    Avoid driving for 2 weeks   Increase activity slowly   Complete by:  As directed    Lifting restrictions   Complete by:  As directed    Avoid lifting over 10 lbs   May shower / Bathe   Complete  by:  As directed    May walk up steps   Complete by:  As directed    No dressing needed   Complete by:  As directed    Sexual Activity Restrictions   Complete by:  As directed    Avoid sexual activity     Allergies as of 05/24/2018   No Known Allergies     Medication List    TAKE these medications   atorvastatin 20 MG tablet Commonly known as:  LIPITOR Take 20 mg by mouth daily.   ibuprofen 800 MG tablet Commonly known as:  ADVIL,MOTRIN Take 1 tablet (800 mg total) by mouth every 8 (eight) hours as needed (mild pain).   levothyroxine 150 MCG tablet Commonly known as:  SYNTHROID, LEVOTHROID take 1 tablet by mouth once daily What changed:  when to take this   oxyCODONE-acetaminophen 5-325 MG tablet Commonly known as:  PERCOCET/ROXICET Take 1-2 tablets by mouth every 6 (six) hours as needed for moderate pain ((when tolerating fluids)).   triamterene-hydrochlorothiazide 37.5-25 MG tablet Commonly known as:  MAXZIDE-25 take 1 tablet by mouth once daily        Signed: Christophe Louis 05/24/2018, 11:48 AM

## 2018-05-24 NOTE — Addendum Note (Signed)
Addendum  created 05/24/18 1150 by Tawni Millers, CRNA   Charge Capture section accepted

## 2018-07-16 MED FILL — CYCLOBENZAPRINE HCL 10 MG T: 10 | 30 days supply | Qty: 30 | Fill #0

## 2018-07-16 MED FILL — TRIAMTERENE/HCTZ 37.5/25 TB: 37.5-25 | 90 days supply | Qty: 90 | Fill #0

## 2018-07-16 MED FILL — LEVOTHYROXINE 150 MCG TAB: 150 | 90 days supply | Qty: 90 | Fill #0

## 2018-08-14 DIAGNOSIS — H524 Presbyopia: Secondary | ICD-10-CM | POA: Diagnosis not present

## 2018-09-14 ENCOUNTER — Other Ambulatory Visit: Payer: Self-pay | Admitting: Family Medicine

## 2018-09-14 DIAGNOSIS — Z1231 Encounter for screening mammogram for malignant neoplasm of breast: Secondary | ICD-10-CM

## 2018-09-26 MED FILL — ATORVASTATIN CALCIUM 20 MG: 20 | 90 days supply | Qty: 90 | Fill #1

## 2018-09-28 ENCOUNTER — Ambulatory Visit
Admission: RE | Admit: 2018-09-28 | Discharge: 2018-09-28 | Disposition: A | Discharge status: Home / Self Care | Payer: 59 | Source: Ambulatory Visit

## 2018-09-28 DIAGNOSIS — Z1231 Encounter for screening mammogram for malignant neoplasm of breast: Secondary | ICD-10-CM

## 2018-09-28 LAB — HM MAMMOGRAPHY

## 2018-10-16 DIAGNOSIS — R5383 Other fatigue: Secondary | ICD-10-CM | POA: Diagnosis not present

## 2018-11-02 MED FILL — TRIAMTERENE/HCTZ 37.5/25 TB: 37.5-25 | 90 days supply | Qty: 90 | Fill #1

## 2018-11-02 MED FILL — LEVOTHYROXINE 150 MCG TAB: 150 | 90 days supply | Qty: 90 | Fill #1

## 2019-01-15 MED FILL — ATORVASTATIN 20 MG TABLET: 20 | 90 days supply | Qty: 90 | Fill #0

## 2019-02-21 MED FILL — LEVOTHYROXINE 150 MCG TAB: 150 | 90 days supply | Qty: 90 | Fill #2

## 2019-02-21 MED FILL — TRIAMTERENE/HCTZ 37.5/25 TB: 37.5-25 | 90 days supply | Qty: 90 | Fill #0

## 2019-06-10 DIAGNOSIS — E785 Hyperlipidemia, unspecified: Secondary | ICD-10-CM | POA: Diagnosis not present

## 2019-06-10 DIAGNOSIS — I10 Essential (primary) hypertension: Secondary | ICD-10-CM | POA: Diagnosis not present

## 2019-06-10 DIAGNOSIS — M5126 Other intervertebral disc displacement, lumbar region: Secondary | ICD-10-CM | POA: Diagnosis not present

## 2019-06-10 DIAGNOSIS — E039 Hypothyroidism, unspecified: Secondary | ICD-10-CM | POA: Diagnosis not present

## 2019-06-10 DIAGNOSIS — Z23 Encounter for immunization: Secondary | ICD-10-CM | POA: Diagnosis not present

## 2019-06-10 MED FILL — ATORVASTATIN 20 MG TABLET: 20 | 90 days supply | Qty: 90 | Fill #0

## 2019-06-10 MED FILL — LEVOTHYROXINE 150 MCG TAB: 150 | 90 days supply | Qty: 90 | Fill #0

## 2019-06-10 MED FILL — TRIAMTERENE-HCTZ 37.5-25 MG: 37.5-25 | 90 days supply | Qty: 90 | Fill #0

## 2019-06-10 MED FILL — CYCLOBENZAPRINE HCL 10 MG T: 10 | 30 days supply | Qty: 30 | Fill #0

## 2019-09-16 MED FILL — LEVOTHYROXINE 150 MCG TAB: 150 | 90 days supply | Qty: 90 | Fill #1

## 2019-09-16 MED FILL — ATORVASTATIN 20 MG TABLET: 20 | 90 days supply | Qty: 90 | Fill #1

## 2019-09-16 MED FILL — TRIAMTERENE-HCTZ 37.5-25 MG: 37.5-25 | 90 days supply | Qty: 90 | Fill #1

## 2019-12-31 MED FILL — ATORVASTATIN 20 MG TABLET: 20 | 90 days supply | Qty: 90 | Fill #2

## 2019-12-31 MED FILL — LEVOTHYROXINE 150 MCG TAB: 150 | 90 days supply | Qty: 90 | Fill #2

## 2019-12-31 MED FILL — TRIAMTERENE-HCTZ 37.5-25 MG: 37.5-25 | 90 days supply | Qty: 90 | Fill #2

## 2020-04-21 MED FILL — TRIAMTERENE-HCTZ 37.5-25 MG: 37.5-25 | 90 days supply | Qty: 90 | Fill #3

## 2020-04-21 MED FILL — LEVOTHYROXINE 150 MCG TAB: 150 | 90 days supply | Qty: 90 | Fill #3

## 2020-04-21 MED FILL — ATORVASTATIN CALCIUM 20 MG: 20 | 90 days supply | Qty: 90 | Fill #3

## 2020-06-12 ENCOUNTER — Ambulatory Visit (HOSPITAL_COMMUNITY)
Admission: EM | Admit: 2020-06-12 | Discharge: 2020-06-12 | Disposition: A | Discharge status: Home / Self Care | Payer: 59 | Attending: Family Medicine | Admitting: Family Medicine

## 2020-06-12 ENCOUNTER — Encounter (HOSPITAL_COMMUNITY): Payer: Self-pay

## 2020-06-12 ENCOUNTER — Other Ambulatory Visit: Payer: Self-pay

## 2020-06-12 DIAGNOSIS — J22 Unspecified acute lower respiratory infection: Secondary | ICD-10-CM

## 2020-06-12 DIAGNOSIS — R059 Cough, unspecified: Secondary | ICD-10-CM | POA: Diagnosis not present

## 2020-06-12 DIAGNOSIS — Z20822 Contact with and (suspected) exposure to covid-19: Secondary | ICD-10-CM | POA: Insufficient documentation

## 2020-06-12 MED ORDER — BENZONATATE 200 MG PO CAPS: 200.0000 mg | ORAL_CAPSULE | Freq: Two times a day (BID) | ORAL | 0 refills | Status: DC | PRN

## 2020-06-12 MED ORDER — DOXYCYCLINE HYCLATE 100 MG PO CAPS: 100.0000 mg | ORAL_CAPSULE | Freq: Two times a day (BID) | ORAL | 0 refills | Status: DC

## 2020-06-12 NOTE — ED Triage Notes (Signed)
Pt presents with complaints of cough and chest congestion x 10 days. Pt is coughing up dark green sputum. Pt has tried taking theraflu and mucinex with no relief.

## 2020-06-12 NOTE — Discharge Instructions (Signed)
Take the antibiotic 2 x a day Take with food Take the tessalon cough medicine  2 x a day Drink lots of water The COVID test should be available by tomorrow

## 2020-06-12 NOTE — ED Provider Notes (Signed)
[facility]    CSN: [id number] Arrival date & time: 06/12/20  1217      History   Chief Complaint Chief Complaint  Patient presents with  . Cough    HPI Courtney Collins is a 59 y.o. female.   HPI  Patient is here with 10 days of cough. She states that the cough is harsh. She is coughing up green sputum. At first she thought it was a virus. Now she is worried the cough will go away. She is Covid vaccinated. She is getting a Covid test today. She has normal taste and smell. No fever chills. No body aches or headaches. No known exposure to Covid. Patient works over at [employer]  Past Medical History:  Diagnosis Date  . Abnormal uterine bleeding   . Arthritis   . Fibroids   . Hypertension   . Hypothyroidism   . Obesity (BMI 30-39.9)     Patient Active Problem List   Diagnosis Date Noted  . S/P hysterectomy 05/23/2018  . Complete tear of rotator cuff 03/09/2016  . Edema 12/14/2011  . Rash and nonspecific skin eruption 09/19/2011  . Vertigo, benign paroxysmal 04/25/2011  . Back muscle spasm 01/12/2011  . Subacromial bursitis 11/25/2010  . IRREGULAR MENSES 06/16/2010  . HYPOTHYROIDISM 08/03/2007  . HYPERLIPIDEMIA 10/12/2006  . OBESITY, NOS 10/12/2006  . HYPERTENSION, BENIGN SYSTEMIC 10/12/2006    Past Surgical History:  Procedure Laterality Date  . CYSTOSCOPY N/A 05/23/2018   Procedure: CYSTOSCOPY;  Surgeon: Christophe Louis, MD;  Location: [facility];  Service: Gynecology;  Laterality: N/A;  . NO PAST SURGERIES    . ROBOTIC ASSISTED TOTAL HYSTERECTOMY WITH BILATERAL SALPINGO OOPHERECTOMY Bilateral 05/23/2018   Procedure: XI ROBOTIC ASSISTED TOTAL HYSTERECTOMY WITH BILATERAL SALPINGO OOPHORECTOMY;  Surgeon: Christophe Louis, MD;  Location: [facility];  Service: Gynecology;  Laterality: Bilateral;  . SHOULDER OPEN ROTATOR CUFF REPAIR Right 03/09/2016   Procedure: OPEN RIGHT ROTATOR CUFF REPAIR WITH GRAFT AND ANCHORS;  Surgeon: Latanya Maudlin, MD;  Location: [facility];   Service: Orthopedics;  Laterality: Right;    OB History   No obstetric history on file.      Home Medications    Prior to Admission medications   Medication Sig Start Date End Date Taking? Authorizing Provider  levothyroxine (SYNTHROID, LEVOTHROID) 150 MCG tablet take 1 tablet by mouth once daily Patient taking differently: Take 150 mcg by mouth daily before breakfast.  06/26/12  Yes McGill, Jacquelyn A, MD  triamterene-hydrochlorothiazide (MAXZIDE-25) 37.5-25 MG per tablet take 1 tablet by mouth once daily Patient taking differently: Take 1 tablet by mouth daily.  06/26/12  Yes McGill, Jacquelyn A, MD  atorvastatin (LIPITOR) 20 MG tablet Take 20 mg by mouth daily. 12/08/15   [provider]  benzonatate (TESSALON) 200 MG capsule Take 1 capsule (200 mg total) by mouth 2 (two) times daily as needed for cough. 06/12/20   Raylene Everts, MD  doxycycline (VIBRAMYCIN) 100 MG capsule Take 1 capsule (100 mg total) by mouth 2 (two) times daily. 06/12/20   Raylene Everts, MD  ibuprofen (ADVIL,MOTRIN) 800 MG tablet Take 1 tablet (800 mg total) by mouth every 8 (eight) hours as needed (mild pain). 05/24/18   Christophe Louis, MD    Family History Family History  Problem Relation Age of Onset  . Hypertension Mother   . Cancer Maternal Aunt   . Cancer Maternal Uncle   . Healthy Father     Social History Social History  Tobacco Use  . Smoking status: Never Smoker  . Smokeless tobacco: Never Used  Vaping Use  . Vaping Use: Never used  Substance Use Topics  . Alcohol use: No  . Drug use: No     Allergies   Patient has no known allergies.   Review of Systems Review of Systems See HPI  Physical Exam Triage Vital Signs ED Triage Vitals  Enc Vitals Group     BP 06/12/20 1251 (!) 179/94     Pulse Rate 06/12/20 1251 94     Resp 06/12/20 1251 20     Temp 06/12/20 1251 98.3 F (36.8 C)     Temp src --      SpO2 06/12/20 1251 97 %     Weight --      Height --       Head Circumference --      Peak Flow --      Pain Score 06/12/20 1249 0     Pain Loc --      Pain Edu? --      Excl. in [place]? --    No data found.  Updated Vital Signs BP (!) 179/94   Pulse 94   Temp 98.3 F (36.8 C)   Resp 20   LMP 02/15/2018 (Approximate)   SpO2 97%     Physical Exam Constitutional:      General: She is not in acute distress.    Appearance: Normal appearance. She is well-developed.  HENT:     Head: Normocephalic and atraumatic.     Right Ear: Tympanic membrane and ear canal normal.     Left Ear: Tympanic membrane and ear canal normal.     Nose: Nose normal. No congestion.     Mouth/Throat:     Pharynx: No posterior oropharyngeal erythema.  Eyes:     Conjunctiva/sclera: Conjunctivae normal.     Pupils: Pupils are equal, round, and reactive to light.  Cardiovascular:     Rate and Rhythm: Normal rate and regular rhythm.     Heart sounds: Normal heart sounds.  Pulmonary:     Effort: Pulmonary effort is normal. No respiratory distress.     Breath sounds: Normal breath sounds.     Comments: Lungs are clear. No rales or wheeze. No chest wall tenderness Abdominal:     Palpations: Abdomen is soft.  Musculoskeletal:        General: Normal range of motion.     Cervical back: Normal range of motion.  Skin:    General: Skin is warm and dry.  Neurological:     Mental Status: She is alert.  Psychiatric:        Behavior: Behavior normal.      UC Treatments / Results  Labs (all labs ordered are listed, but only abnormal results are displayed) Labs Reviewed  SARS CORONAVIRUS 2 (TAT 6-24 HRS)    EKG   Radiology No results found.  Procedures Procedures (including critical care time)  Medications Ordered in UC Medications - No data to display  Initial Impression / Assessment and Plan / UC Course  I have reviewed the triage vital signs and the nursing notes.  Pertinent labs & imaging results that were available during my care of the patient were  reviewed by me and considered in my medical decision making (see chart for details).     Final Clinical Impressions(s) / UC Diagnoses   Final diagnoses:  Lower respiratory infection (e.g., bronchitis, pneumonia, pneumonitis, pulmonitis)  Discharge Instructions     Take the antibiotic 2 x a day Take with food Take the tessalon cough medicine  2 x a day Drink lots of water The COVID test should be available by tomorrow   ED Prescriptions    Medication Sig Dispense Auth. Provider   benzonatate (TESSALON) 200 MG capsule Take 1 capsule (200 mg total) by mouth 2 (two) times daily as needed for cough. 20 capsule Raylene Everts, MD   doxycycline (VIBRAMYCIN) 100 MG capsule Take 1 capsule (100 mg total) by mouth 2 (two) times daily. 14 capsule Raylene Everts, MD     PDMP not reviewed this encounter.   Raylene Everts, MD 06/12/20 [phone]

## 2020-06-13 LAB — SARS CORONAVIRUS 2 (TAT 6-24 HRS): SARS Coronavirus 2: NEGATIVE

## 2020-08-19 ENCOUNTER — Telehealth: Payer: Self-pay

## 2020-08-19 ENCOUNTER — Other Ambulatory Visit: Payer: Self-pay

## 2020-08-19 ENCOUNTER — Other Ambulatory Visit (HOSPITAL_COMMUNITY): Payer: Self-pay | Admitting: Family Medicine

## 2020-08-19 MED ORDER — ATORVASTATIN CALCIUM 20 MG PO TABS: 20.0000 mg | ORAL_TABLET | Freq: Every day | ORAL | 0 refills | Status: DC

## 2020-08-19 MED ORDER — LEVOTHYROXINE SODIUM 150 MCG PO TABS: 150.0000 ug | ORAL_TABLET | Freq: Every day | ORAL | 0 refills | Status: DC

## 2020-08-19 MED ORDER — TRIAMTERENE-HCTZ 37.5-25 MG PO TABS: 1.0000 | ORAL_TABLET | Freq: Every day | ORAL | 0 refills | Status: DC

## 2020-08-19 MED FILL — ATORVASTATIN CALCIUM 20 MG: 20 | 30 days supply | Qty: 30 | Fill #0

## 2020-08-19 MED FILL — TRIAMTERENE-HCTZ 37.5-25 MG: 37.5-25 | 30 days supply | Qty: 30 | Fill #0

## 2020-08-19 MED FILL — LEVOTHYROXINE 150 MCG TAB: 150 | 30 days supply | Qty: 30 | Fill #0

## 2020-08-19 NOTE — Telephone Encounter (Signed)
Pt. Called had to cancel her new pt. Apt today because she tested positive for covid on 08/16/20 I got her moved until 09/03/20. She did mention that she is about to run out of her medicine wanted to know if there was any way you could give her a short term supply of her three medicine she is on. She is on Atorvastatin 20 mg, Levothyroxine , and triamtrene-hydrochlorothiazide 37.5-25 mg. She said she uses [facility] pt. [facility].

## 2020-08-19 NOTE — Telephone Encounter (Signed)
Ok to give her 30 days only.

## 2020-08-19 NOTE — Telephone Encounter (Signed)
Pt. Given 30 day supply

## 2020-08-20 ENCOUNTER — Ambulatory Visit: Payer: Self-pay | Admitting: Family Medicine

## 2020-09-03 ENCOUNTER — Encounter: Payer: Self-pay | Admitting: Family Medicine

## 2020-09-03 ENCOUNTER — Other Ambulatory Visit: Payer: Self-pay

## 2020-09-03 ENCOUNTER — Ambulatory Visit (INDEPENDENT_AMBULATORY_CARE_PROVIDER_SITE_OTHER): Payer: Managed Care, Other (non HMO) | Admitting: Family Medicine

## 2020-09-03 VITALS — BP 120/70 | HR 85 | Ht 64.0 in | Wt 230.4 lb

## 2020-09-03 DIAGNOSIS — E785 Hyperlipidemia, unspecified: Secondary | ICD-10-CM

## 2020-09-03 DIAGNOSIS — E039 Hypothyroidism, unspecified: Secondary | ICD-10-CM

## 2020-09-03 DIAGNOSIS — Z23 Encounter for immunization: Secondary | ICD-10-CM | POA: Diagnosis not present

## 2020-09-03 DIAGNOSIS — Z6839 Body mass index (BMI) 39.0-39.9, adult: Secondary | ICD-10-CM

## 2020-09-03 DIAGNOSIS — I10 Essential (primary) hypertension: Secondary | ICD-10-CM

## 2020-09-03 NOTE — Patient Instructions (Signed)

## 2020-09-03 NOTE — Progress Notes (Signed)
   Subjective:    Patient ID: Eda Keys, female    DOB: 05-Feb-1961, 60 y.o.   MRN: [id number]  HPI Chief Complaint  Patient presents with  . new pt    New pt get established. Usually get blood done for thyroid   She is new to the practice and here to establish care. Previous medical care: [facility] for several years. Last seen there in 2021.   Other providers: OB/GYN  HTN-reports taking triamterene/HCTZ daily without any issues.  She checks her blood pressure at home and states it is "good".  States she does not add salt to her food.  Hypothyroidism -reports taking levothyroxine daily with no concerns.  States she has not had her thyroid function checked in several months.  HL-states she takes atorvastatin 20 mg daily.  No side effects.  Denies fever, chills, dizziness, chest pain, palpitations, shortness of breath, abdominal pain, N/V/D, urinary symptoms, LE edema.    Social history: lives with her sister and mother, 1 daughter, works at a [employer] in [employer].  Denies smoking, drinking alcohol, drug use   Immunizations: Pfizer Covid vaccine  States she had Covid on January 2nd.  She would like to get her flu shot today  Health maintenance:  Mammogram: Overdue Colonoscopy: She believes she is up-to-date Last Gynecological Exam: Hysterectomy   Reviewed allergies, medications, past medical, surgical, family, and social history.    Review of Systems Pertinent positives and negatives in the history of present illness.     Objective:   Physical Exam BP 120/70   Pulse 85   Ht 5\' 4"  (1.626 m)   Wt 230 lb 6.4 oz (104.5 kg)   LMP 02/15/2018 (Approximate)   BMI 39.55 kg/m   Alert and in no distress.  Cardiac exam shows a regular sinus rhythm without murmurs or gallops. Lungs are clear to auscultation.  Extremities without edema.  Skin is warm and dry.  Normal speech, mood and memory.       Assessment & Plan:  Acquired hypothyroidism -  Plan: TSH  Hyperlipidemia, unspecified hyperlipidemia type - Plan: Lipid panel  HYPERTENSION, BENIGN SYSTEMIC - Plan: CBC with Differential/Platelet, Comprehensive metabolic panel  Class 2 severe obesity due to excess calories with serious comorbidity and body mass index (BMI) of 39.0 to 39.9 in adult [facility]) - Plan: CBC with Differential/Platelet, Comprehensive metabolic panel, TSH, Lipid panel  Needs flu shot - Plan: Flu Vaccine QUAD 36+ mos IM  She is a pleasant 60 year old female who is new to the practice and here to establish care.  She reports taking her medications for thyroid, blood pressure and her statin daily without any concerns.  She is requesting labs today.  Previous medical care at [facility] and I will request these records. Discussed potential long-term health consequences associated with obesity. I encouraged a low-sodium and low-fat diet.  DASH diet handout provided. She will continue keeping a close eye on her blood pressure. I will check labs and follow-up.  She may return for her COVID booster when she is due.  She may also return for a CPE at her convenience.

## 2020-09-04 LAB — CBC WITH DIFFERENTIAL/PLATELET
Basophils Absolute: 0.1 10*3/uL (ref 0.0–0.2)
Basos: 1 %
EOS (ABSOLUTE): 0.3 10*3/uL (ref 0.0–0.4)
Eos: 4 %
Hematocrit: 44.4 % (ref 34.0–46.6)
Hemoglobin: 14.7 g/dL (ref 11.1–15.9)
Immature Grans (Abs): 0 10*3/uL (ref 0.0–0.1)
Immature Granulocytes: 0 %
Lymphocytes Absolute: 1.8 10*3/uL (ref 0.7–3.1)
Lymphs: 20 %
MCH: 28.6 pg (ref 26.6–33.0)
MCHC: 33.1 g/dL (ref 31.5–35.7)
MCV: 86 fL (ref 79–97)
Monocytes Absolute: 0.7 10*3/uL (ref 0.1–0.9)
Monocytes: 8 %
Neutrophils Absolute: 6.2 10*3/uL (ref 1.4–7.0)
Neutrophils: 67 %
Platelets: 449 10*3/uL (ref 150–450)
RBC: 5.14 x10E6/uL (ref 3.77–5.28)
RDW: 13.1 % (ref 11.7–15.4)
WBC: 9.1 10*3/uL (ref 3.4–10.8)

## 2020-09-04 LAB — COMPREHENSIVE METABOLIC PANEL
ALT: 21 IU/L (ref 0–32)
AST: 22 IU/L (ref 0–40)
Albumin/Globulin Ratio: 1.5 (ref 1.2–2.2)
Albumin: 4.7 g/dL (ref 3.8–4.8)
Alkaline Phosphatase: 109 IU/L (ref 44–121)
BUN/Creatinine Ratio: 17 (ref 12–28)
BUN: 13 mg/dL (ref 8–27)
Bilirubin Total: 0.7 mg/dL (ref 0.0–1.2)
CO2: 26 mmol/L (ref 20–29)
Calcium: 10 mg/dL (ref 8.7–10.3)
Chloride: 98 mmol/L (ref 96–106)
Creatinine, Ser: 0.75 mg/dL (ref 0.57–1.00)
GFR calc Af Amer: 99 mL/min/{1.73_m2} (ref >59)
GFR calc non Af Amer: 86 mL/min/{1.73_m2} (ref >59)
Globulin, Total: 3.2 g/dL (ref 1.5–4.5)
Glucose: 95 mg/dL (ref 65–99)
Potassium: 4.4 mmol/L (ref 3.5–5.2)
Sodium: 140 mmol/L (ref 134–144)
Total Protein: 7.9 g/dL (ref 6.0–8.5)

## 2020-09-04 LAB — TSH: TSH: 0.63 u[IU]/mL (ref 0.450–4.500)

## 2020-09-04 LAB — LIPID PANEL
Chol/HDL Ratio: 3.6 ratio (ref 0.0–4.4)
Cholesterol, Total: 160 mg/dL (ref 100–199)
HDL: 44 mg/dL (ref >39)
LDL Chol Calc (NIH): 86 mg/dL (ref 0–99)
Triglycerides: 176 mg/dL — ABNORMAL HIGH (ref 0–149)
VLDL Cholesterol Cal: 30 mg/dL (ref 5–40)

## 2020-09-22 ENCOUNTER — Other Ambulatory Visit: Payer: Self-pay | Admitting: Family Medicine

## 2020-09-23 MED ORDER — TRIAMTERENE-HCTZ 37.5-25 MG PO TABS: 1.0000 | ORAL_TABLET | Freq: Every day | ORAL | 0 refills | Status: DC

## 2020-09-23 MED ORDER — LEVOTHYROXINE SODIUM 150 MCG PO TABS: 150.0000 ug | ORAL_TABLET | Freq: Every day | ORAL | 0 refills | Status: DC

## 2020-09-23 MED ORDER — ATORVASTATIN CALCIUM 20 MG PO TABS: 20.0000 mg | ORAL_TABLET | Freq: Every day | ORAL | 0 refills | Status: DC

## 2020-10-15 ENCOUNTER — Other Ambulatory Visit: Payer: Self-pay

## 2020-10-15 ENCOUNTER — Telehealth: Payer: Self-pay

## 2020-10-15 MED ORDER — LEVOTHYROXINE SODIUM 150 MCG PO TABS: 150.0000 ug | ORAL_TABLET | Freq: Every day | ORAL | 0 refills | Status: DC

## 2020-10-15 MED ORDER — TRIAMTERENE-HCTZ 37.5-25 MG PO TABS: 1.0000 | ORAL_TABLET | Freq: Every day | ORAL | 0 refills | Status: DC

## 2020-10-15 MED ORDER — ATORVASTATIN CALCIUM 20 MG PO TABS: 20.0000 mg | ORAL_TABLET | Freq: Every day | ORAL | 0 refills | Status: DC

## 2020-10-15 NOTE — Telephone Encounter (Signed)
Meds refilled.

## 2020-10-15 NOTE — Telephone Encounter (Signed)
Ok to send in these refills.

## 2020-10-15 NOTE — Telephone Encounter (Signed)
Recv'd fax from [facility] requesting 90 day refills on Synthroid 150 mcg & Atorvastatin 20mg  & Triamterene/HCTZ 37.5/25mg 

## 2020-10-17 NOTE — Telephone Encounter (Signed)
done

## 2020-11-10 ENCOUNTER — Encounter: Payer: Self-pay | Admitting: Family Medicine

## 2020-11-10 ENCOUNTER — Other Ambulatory Visit: Payer: Self-pay

## 2020-11-10 ENCOUNTER — Ambulatory Visit (INDEPENDENT_AMBULATORY_CARE_PROVIDER_SITE_OTHER): Payer: Managed Care, Other (non HMO) | Admitting: Family Medicine

## 2020-11-10 VITALS — BP 124/84 | HR 56 | Wt 230.0 lb

## 2020-11-10 DIAGNOSIS — M25532 Pain in left wrist: Secondary | ICD-10-CM

## 2020-11-10 NOTE — Patient Instructions (Signed)
Try taking 800 mg of ibuprofen 3 times per day with food or you can take 2 Aleve twice daily with food.  Do not take both.  Use ice or heat.  Elevate the area above your heart for pain relief.  I also recommend using a topical pain medication such as Voltaren gel.  Rest the wrist but use good range of motion exercises.  Let me know if it is not improving over the next week.

## 2020-11-10 NOTE — Progress Notes (Signed)
   Subjective:    Patient ID: Courtney Collins, female    DOB: [birth year], 60 y.o.   MRN: [id number]  HPI Chief Complaint  Patient presents with  . left wrist pain    Left wrist pain x 2 days.    Complains of left wrist pain for the past 2 days with a history of similar pain. Pain is worse with certain movements. Pain improves with rest. She took ibuprofen which helped.  States she bought a wrap which also seems to help.  No known injury.  History of jobs that require heavy lifting and repetitive motion.   She thinks resting her wrist today and tomorrow will help. Rest has helped in the past. Requests an out of work note.   No numbness, tingling or weakness.   Dr. Gladstone Lighter is her orthopedist.   No fever, chills, N/V. No other arthralgias.    Review of Systems     Objective:   Physical Exam Constitutional:      General: She is not in acute distress.    Appearance: Normal appearance.  Musculoskeletal:     Left upper arm: Normal.     Left elbow: Normal.     Left forearm: Normal.     Left wrist: No swelling, deformity, tenderness, bony tenderness, snuff box tenderness or crepitus. Normal range of motion. Normal pulse.     Left hand: Normal.     Comments: Pain with supination only to ulnar aspect of her forearm proximal to her wrist. Negative exam otherwise.   Skin:    General: Skin is warm and dry.     Capillary Refill: Capillary refill takes less than 2 seconds.  Neurological:     General: No focal deficit present.     Mental Status: She is alert.     Sensory: No sensory deficit.     Motor: No weakness.    BP 124/84   Pulse (!) 56   Wt 230 lb (104.3 kg)   LMP 02/15/2018 (Approximate)   BMI 39.48 kg/m         Assessment & Plan:  Left wrist pain  We will try conservative management.   No neurological deficits. She will try 80 mg ibuprofen 3 times daily with food.  Use ice or heat and elevate the area.  She may also try a topical pain medication.  I will  give her an out of work note for today and tomorrow.  Recommend that she follow-up if she has any new or worsening symptoms or in 1 to 2 weeks if she is not improving.  She may need a referral back to her orthopedist.

## 2020-11-24 ENCOUNTER — Telehealth: Payer: Self-pay | Admitting: Family Medicine

## 2020-11-24 ENCOUNTER — Encounter: Payer: Self-pay | Admitting: Internal Medicine

## 2020-11-24 NOTE — Telephone Encounter (Signed)
Requested records received from [place] at  [facility]

## 2020-11-25 ENCOUNTER — Encounter: Payer: Self-pay | Admitting: Family Medicine

## 2021-01-23 ENCOUNTER — Other Ambulatory Visit: Payer: Self-pay | Admitting: Family Medicine

## 2021-04-22 ENCOUNTER — Other Ambulatory Visit: Payer: Self-pay

## 2021-04-22 ENCOUNTER — Emergency Department (HOSPITAL_COMMUNITY)
Admission: EM | Admit: 2021-04-22 | Discharge: 2021-04-23 | Disposition: A | Discharge status: Home / Self Care | Payer: Managed Care, Other (non HMO) | Attending: Emergency Medicine | Admitting: Emergency Medicine

## 2021-04-22 ENCOUNTER — Emergency Department (HOSPITAL_COMMUNITY): Payer: Managed Care, Other (non HMO)

## 2021-04-22 DIAGNOSIS — Z79899 Other long term (current) drug therapy: Secondary | ICD-10-CM | POA: Insufficient documentation

## 2021-04-22 DIAGNOSIS — R519 Headache, unspecified: Secondary | ICD-10-CM | POA: Diagnosis present

## 2021-04-22 DIAGNOSIS — I10 Essential (primary) hypertension: Secondary | ICD-10-CM | POA: Diagnosis not present

## 2021-04-22 DIAGNOSIS — E039 Hypothyroidism, unspecified: Secondary | ICD-10-CM | POA: Diagnosis not present

## 2021-04-22 DIAGNOSIS — R000 Tachycardia, unspecified: Secondary | ICD-10-CM | POA: Insufficient documentation

## 2021-04-22 LAB — CBC
HCT: 49 % — ABNORMAL HIGH (ref 36.0–46.0)
Hemoglobin: 16.6 g/dL — ABNORMAL HIGH (ref 12.0–15.0)
MCH: 29.5 pg (ref 26.0–34.0)
MCHC: 33.9 g/dL (ref 30.0–36.0)
MCV: 87.2 fL (ref 80.0–100.0)
Platelets: 441 10*3/uL — ABNORMAL HIGH (ref 150–400)
RBC: 5.62 MIL/uL — ABNORMAL HIGH (ref 3.87–5.11)
RDW: 13.1 % (ref 11.5–15.5)
WBC: 12 10*3/uL — ABNORMAL HIGH (ref 4.0–10.5)
nRBC: 0 % (ref 0.0–0.2)

## 2021-04-22 LAB — BASIC METABOLIC PANEL
Anion gap: 12 (ref 5–15)
BUN: 9 mg/dL (ref 8–23)
CO2: 26 mmol/L (ref 22–32)
Calcium: 9.8 mg/dL (ref 8.9–10.3)
Chloride: 98 mmol/L (ref 98–111)
Creatinine, Ser: 0.8 mg/dL (ref 0.44–1.00)
GFR, Estimated: >60 mL/min (ref >60)
Glucose, Bld: 126 mg/dL — ABNORMAL HIGH (ref 70–99)
Potassium: 3.5 mmol/L (ref 3.5–5.1)
Sodium: 136 mmol/L (ref 135–145)

## 2021-04-22 LAB — TROPONIN I (HIGH SENSITIVITY): Troponin I (High Sensitivity): 5 ng/L (ref <18)

## 2021-04-22 MED ORDER — SODIUM CHLORIDE 0.9 % IV BOLUS
500.0000 mL | Freq: Once | INTRAVENOUS | Status: AC
  Administered 2021-04-23: 500 mL via INTRAVENOUS

## 2021-04-22 MED ORDER — DEXAMETHASONE SODIUM PHOSPHATE 10 MG/ML IJ SOLN
10.0000 mg | Freq: Once | INTRAMUSCULAR | Status: AC
  Administered 2021-04-23: 10 mg via INTRAVENOUS
  Filled 2021-04-22: qty 1

## 2021-04-22 MED ORDER — METOCLOPRAMIDE HCL 5 MG/ML IJ SOLN
10.0000 mg | Freq: Once | INTRAMUSCULAR | Status: AC
  Administered 2021-04-23: 10 mg via INTRAVENOUS
  Filled 2021-04-22: qty 2

## 2021-04-22 MED ORDER — DIPHENHYDRAMINE HCL 25 MG PO CAPS
50.0000 mg | ORAL_CAPSULE | Freq: Once | ORAL | Status: AC
  Administered 2021-04-23: 50 mg via ORAL
  Filled 2021-04-22: qty 2

## 2021-04-22 NOTE — ED Provider Notes (Signed)
Emergency Medicine Provider Triage Evaluation Note  Courtney Collins , a 60 y.o. female  was evaluated in triage.  Pt complains of headache and dizziness x 1 day. Pt checks BP at home and readings have been high for about a week. Highest reading was systolics in 0000000. Took BP medicine today.   Review of Systems  Positive: Headache, dizziness Negative: CP, SOB, syncope, falls  Physical Exam  BP (!) 224/145 (BP Location: Left Arm)   Pulse (!) 115   Temp 99 F (37.2 C) (Oral)   Resp 16   LMP 02/15/2018 (Approximate)   SpO2 97%  Gen:   Awake, no distress   Resp:  Normal effort  MSK:   Moves extremities without difficulty  Other:  Repeat BP 197/123  Medical Decision Making  Medically screening exam initiated at 7:13 PM.  Appropriate orders placed.  Courtney Collins was informed that the remainder of the evaluation will be completed by another provider, this initial triage assessment does not replace that evaluation, and the importance of remaining in the ED until their evaluation is complete.     Estill Cotta 04/22/21 1919    Charlesetta Shanks, MD 04/23/21 0006

## 2021-04-22 NOTE — ED Notes (Signed)
Provider at bedside at this time

## 2021-04-22 NOTE — ED Provider Notes (Signed)
[facility] DEPARTMENT Provider Note   CSN: [id number] Arrival date & time: 04/22/21  1841     History Chief Complaint  Patient presents with   Headache   Dizziness   Hypertension    Courtney Collins is a 60 y.o. female presenting for evalaution of HA and HTN.   Patient states for the past week she has noticed that her blood pressure is elevated.  She checks her blood pressure regularly, it is normally normal, however recently has been elevated.  Today she developed a headache, which is not normal for her.  No history of migraines or headaches.  She also reports her blood pressure is higher than normal.  She has associated dizziness, this is worse when she is standing.  She has a history of vertigo, but states this feels different.  She denies vision changes, slurred speech, chest pain, shortness breath, nausea, vomiting, abdominal pain, change in urination or bowel movements.  She denies medication changes.  She has not been taking any increased NSAIDs, however does take ibuprofen 800 mg a few times a week.  No change in diet.  She is on triamterene for BP which she has been taking as prescribed, last dose was this morning.  Additional history obtained in chart review.  Patient with a history of hypertension, hypothyroidism, obesity, fibroids.  HPI     Past Medical History:  Diagnosis Date   Abnormal uterine bleeding    Arthritis    Fibroids    Hypertension    Hypothyroidism    Obesity (BMI 30-39.9)     Patient Active Problem List   Diagnosis Date Noted   S/P hysterectomy 05/23/2018   Complete tear of rotator cuff 03/09/2016   Edema 12/14/2011   Rash and nonspecific skin eruption 09/19/2011   Vertigo, benign paroxysmal 04/25/2011   Back muscle spasm 01/12/2011   Subacromial bursitis 11/25/2010   IRREGULAR MENSES 06/16/2010   Hypothyroidism 08/03/2007   Hyperlipidemia 10/12/2006   OBESITY, NOS 10/12/2006   HYPERTENSION, BENIGN SYSTEMIC  10/12/2006    Past Surgical History:  Procedure Laterality Date   CYSTOSCOPY N/A 05/23/2018   Procedure: CYSTOSCOPY;  Surgeon: Christophe Louis, MD;  Location: [facility];  Service: Gynecology;  Laterality: N/A;   NO PAST SURGERIES     ROBOTIC ASSISTED TOTAL HYSTERECTOMY WITH BILATERAL SALPINGO OOPHERECTOMY Bilateral 05/23/2018   Procedure: XI ROBOTIC ASSISTED TOTAL HYSTERECTOMY WITH BILATERAL SALPINGO OOPHORECTOMY;  Surgeon: Christophe Louis, MD;  Location: [facility];  Service: Gynecology;  Laterality: Bilateral;   SHOULDER OPEN ROTATOR CUFF REPAIR Right 03/09/2016   Procedure: OPEN RIGHT ROTATOR CUFF REPAIR WITH GRAFT AND ANCHORS;  Surgeon: Latanya Maudlin, MD;  Location: [facility];  Service: Orthopedics;  Laterality: Right;     OB History   No obstetric history on file.     Family History  Problem Relation Age of Onset   Hypertension Mother    Cancer Maternal Aunt    Cancer Maternal Uncle    Healthy Father     Social History   Tobacco Use   Smoking status: Never   Smokeless tobacco: Never  Vaping Use   Vaping Use: Never used  Substance Use Topics   Alcohol use: No   Drug use: No    Home Medications Prior to Admission medications   Medication Sig Start Date End Date Taking? Authorizing Provider  meclizine (ANTIVERT) 25 MG tablet Take 1 tablet (25 mg total) by mouth 3 (three) times daily as needed for dizziness. 04/23/21  Yes  Alynn Ellithorpe, PA-C  atorvastatin (LIPITOR) 20 MG tablet Take 1 tablet (20 mg total) by mouth daily. 10/15/20   Henson, Vickie L, NP-C  atorvastatin (LIPITOR) 20 MG tablet TAKE 1 TABLET DAILY 01/25/21   Henson, Vickie L, NP-C  atorvastatin (LIPITOR) 20 MG tablet TAKE 1 TABLET BY MOUTH ONCE A DAY 08/19/20 08/19/21  Henson, Vickie L, NP-C  levothyroxine (SYNTHROID) 150 MCG tablet Take 1 tablet (150 mcg total) by mouth daily. 10/15/20   Henson, Vickie L, NP-C  levothyroxine (SYNTHROID) 150 MCG tablet TAKE 1 TABLET DAILY (NEED APPOINTMENT FOR ANY FURTHER REFILLS) 01/25/21   Harland Dingwall L, NP-C  levothyroxine (SYNTHROID) 150 MCG tablet TAKE 1 TABLET BY MOUTH ONCE A DAY (NEED APPT FOR REFILLS) 08/19/20 08/19/21  Henson, Loletha Carrow L, NP-C  triamterene-hydrochlorothiazide (MAXZIDE-25) 37.5-25 MG tablet TAKE 1 TABLET DAILY (NEED APPOINTMENT FOR FURTHER REFILLS. WILL NOT REFILL WITHOUT APPOINTMENT BEING SCHEDULED) 01/25/21   Henson, Vickie L, NP-C  triamterene-hydrochlorothiazide (MAXZIDE-25) 37.5-25 MG tablet TAKE 1 TABLET BY MOUTH DAILY. (NEED APPT FOR REFILLS) 08/19/20 08/19/21  Girtha Rm, NP-C    Allergies    Patient has no known allergies.  Review of Systems   Review of Systems  Neurological:  Positive for dizziness and headaches.  All other systems reviewed and are negative.  Physical Exam Updated Vital Signs BP (!) 168/94 (BP Location: Right Arm)   Pulse 87   Temp 98 F (36.7 C) (Oral)   Resp 15   Ht '5\' 3"'$  (1.6 m)   Wt 102.5 kg   LMP 02/15/2018 (Approximate)   SpO2 97%   BMI 40.03 kg/m   Physical Exam Vitals and nursing note reviewed.  Constitutional:      General: She is not in acute distress.    Appearance: Normal appearance. She is obese.     Comments: nontoxic  HENT:     Head: Normocephalic and atraumatic.  Eyes:     Conjunctiva/sclera: Conjunctivae normal.     Pupils: Pupils are equal, round, and reactive to light.  Cardiovascular:     Rate and Rhythm: Normal rate and regular rhythm.     Pulses: Normal pulses.  Pulmonary:     Effort: Pulmonary effort is normal. No respiratory distress.     Breath sounds: Normal breath sounds. No wheezing.     Comments: Speaking in full sentences.  Clear lung sounds in all fields. Abdominal:     General: There is no distension.     Palpations: Abdomen is soft. There is no mass.     Tenderness: There is no abdominal tenderness. There is no guarding or rebound.  Musculoskeletal:        General: Normal range of motion.     Cervical back: Normal range of motion and neck supple.  Skin:    General: Skin is  warm and dry.     Capillary Refill: Capillary refill takes less than 2 seconds.  Neurological:     Mental Status: She is alert and oriented to person, place, and time.     GCS: GCS eye subscore is 4. GCS verbal subscore is 5. GCS motor subscore is 6.     Cranial Nerves: Cranial nerves are intact.     Sensory: Sensation is intact.     Motor: Motor function is intact.     Coordination: Coordination is intact.     Comments: CN intact.  Nose to finger intact.  Strength and sensation intact x4.  Fine movement and coordination intact.  Ambulatory without  ataxia, however patient does report increased dizziness with ambulation.  Psychiatric:        Mood and Affect: Mood and affect normal.        Speech: Speech normal.        Behavior: Behavior normal.    ED Results / Procedures / Treatments   Labs (all labs ordered are listed, but only abnormal results are displayed) Labs Reviewed  BASIC METABOLIC PANEL - Abnormal; Notable for the following components:      Result Value   Glucose, Bld 126 (*)    All other components within normal limits  CBC - Abnormal; Notable for the following components:   WBC 12.0 (*)    RBC 5.62 (*)    Hemoglobin 16.6 (*)    HCT 49.0 (*)    Platelets 441 (*)    All other components within normal limits  TROPONIN I (HIGH SENSITIVITY)  TROPONIN I (HIGH SENSITIVITY)    EKG EKG Interpretation  Date/Time:  Thursday April 22 2021 18:49:02 EDT Ventricular Rate:  113 PR Interval:  150 QRS Duration: 78 QT Interval:  348 QTC Calculation: 477 R Axis:   16 Text Interpretation: Sinus tachycardia with occasional Premature ventricular complexes and Fusion complexes Minimal voltage criteria for LVH, may be normal variant ( R in aVL ) Cannot rule out Anterior infarct , age undetermined Abnormal ECG When compared with ECG of 05/18/2018. HEART RATE has increased Premature ventricular complexes are now present Confirmed by Delora Fuel ([id number]) on 04/23/2021 2:19:52  AM  Radiology CT HEAD WO CONTRAST (5MM)  Result Date: 04/23/2021 CLINICAL DATA:  Mental status changes EXAM: CT HEAD WITHOUT CONTRAST TECHNIQUE: Contiguous axial images were obtained from the base of the skull through the vertex without intravenous contrast. COMPARISON:  None. FINDINGS: Brain: No acute intracranial abnormality. Specifically, no hemorrhage, hydrocephalus, mass lesion, acute infarction, or significant intracranial injury. Vascular: No hyperdense vessel or unexpected calcification. Skull: No acute calvarial abnormality. Sinuses/Orbits: No acute findings Other: None IMPRESSION: No acute intracranial abnormality. Electronically Signed   By: Rolm Baptise M.D.   On: 04/23/2021 00:55   MR BRAIN WO CONTRAST  Result Date: 04/23/2021 CLINICAL DATA:  60 year old female with headache and dizziness. Hypertensive, [id number] systolic. EXAM: MRI HEAD WITHOUT CONTRAST TECHNIQUE: Multiplanar, multiecho pulse sequences of the brain and surrounding structures were obtained without intravenous contrast. COMPARISON:  Head CT without contrast 0041 hours today. FINDINGS: Brain: No restricted diffusion to suggest acute infarction. No midline shift, mass effect, evidence of mass lesion, ventriculomegaly, extra-axial collection or acute intracranial hemorrhage. Cervicomedullary junction and pituitary are within normal limits. Cerebral volume is within normal limits for age. Patchy and scattered bilateral white matter T2 and FLAIR hyperintensity is mild to moderate for age, but in a nonspecific configuration. No cortical encephalomalacia or chronic cerebral blood products identified. Deep gray matter nuclei, brainstem and cerebellum appear normal. Left basal ganglia perivascular space (normal variant). Vascular: Major intracranial vascular flow voids are preserved with diminutive vertebrobasilar system on the basis of fetal PCA origins, normal variant. Skull and upper cervical spine: Cervical disc and endplate degeneration at  C4-C5 may result in mild spinal stenosis there. Visualized bone marrow signal is within normal limits. Sinuses/Orbits: Negative orbits. Paranasal sinuses and mastoids are stable and well aerated. Other: Visible internal auditory structures appear normal. Visible scalp and face appear negative. IMPRESSION: 1. No acute intracranial abnormality. 2. Up to moderate for age cerebral white matter signal changes, nonspecific but most commonly due to chronic small vessel disease. 3. Disc  and endplate degeneration at C4-C5 with possible mild spinal stenosis. Electronically Signed   By: Genevie Ann M.D.   On: 04/23/2021 05:03    Procedures Procedures   Medications Ordered in ED Medications  metoCLOPramide (REGLAN) injection 10 mg (10 mg Intravenous Given 04/23/21 0015)  dexamethasone (DECADRON) injection 10 mg (10 mg Intravenous Given 04/23/21 0016)  diphenhydrAMINE (BENADRYL) capsule 50 mg (50 mg Oral Given 04/23/21 0015)  sodium chloride 0.9 % bolus 500 mL (0 mLs Intravenous Stopped 04/23/21 0255)  meclizine (ANTIVERT) tablet 25 mg (25 mg Oral Given 04/23/21 0211)  acetaminophen (TYLENOL) tablet 1,000 mg (1,000 mg Oral Given 04/23/21 [id number])    ED Course  I have reviewed the triage vital signs and the nursing notes.  Pertinent labs & imaging results that were available during my care of the patient were reviewed by me and considered in my medical decision making (see chart for details).    MDM Rules/Calculators/A&P                           Patient presenting for evaluation of hypertension, headache, dizziness.  On exam, patient peers nontoxic.  Neuro exam is overall reassuring outside of some dizziness when ambulating however patient is not ataxic.  Consider migraine/headache versus hypertensive emergency versus stroke versus vertigo.  Labs obtained from triage interpreted by me, overall reassuring.  We will add on head CT in the setting of headache and hypertension.  Will give headache cocktail, check orthostatics,  and reassess.  On reevaluation, patient reports headache is much improved.  CT is negative.  However patient continues to have intermittent dizziness.  As she states this is different than her normal vertigo, will obtain MRI to ensure no stroke or central cause for her dizziness.  MRI negative for acute findings.  Discussed with patient.  Discussed symptomatic treatment for headache.  Discussed importance of close follow-up with PCP for blood pressure management.  Discussed that at this time there does not appear to be any emergent or life-threatening condition requiring hospitalization.  At this time, patient appears safe for discharge.  Return precautions given.  Patient states she understands and agrees to plan.   Final Clinical Impression(s) / ED Diagnoses Final diagnoses:  Hypertension, unspecified type  Acute nonintractable headache, unspecified headache type    Rx / DC Orders ED Discharge Orders          Ordered    meclizine (ANTIVERT) 25 MG tablet  3 times daily PRN        04/23/21 0553             Franchot Heidelberg, PA-C [phone]    Delora Fuel, MD [phone]

## 2021-04-22 NOTE — ED Triage Notes (Signed)
Patient c/o headache, dizziness and high blood pressure for 1 week and worst today,

## 2021-04-23 ENCOUNTER — Emergency Department (HOSPITAL_COMMUNITY): Payer: Managed Care, Other (non HMO)

## 2021-04-23 LAB — TROPONIN I (HIGH SENSITIVITY): Troponin I (High Sensitivity): 5 ng/L (ref <18)

## 2021-04-23 MED ORDER — MECLIZINE HCL 25 MG PO TABS: 25.0000 mg | ORAL_TABLET | Freq: Three times a day (TID) | ORAL | 0 refills | Status: DC | PRN

## 2021-04-23 MED ORDER — ACETAMINOPHEN 500 MG PO TABS
1000.0000 mg | ORAL_TABLET | Freq: Once | ORAL | Status: AC
  Administered 2021-04-23: 1000 mg via ORAL
  Filled 2021-04-23: qty 2

## 2021-04-23 MED ORDER — MECLIZINE HCL 25 MG PO TABS
25.0000 mg | ORAL_TABLET | Freq: Once | ORAL | Status: AC
  Administered 2021-04-23: 25 mg via ORAL
  Filled 2021-04-23: qty 1

## 2021-04-23 NOTE — ED Notes (Signed)
Patient transported to CT 

## 2021-04-23 NOTE — Discharge Instructions (Signed)
It is very important that you follow up with you primary care doctor for further blood pressure management.  Take meclizine as needed for dizziness.  Use tylenol as needed for headache.  Make sure you are staying well hydrated with water.  Return to the ER if you develop chest pain, vision changes, slurred speech, numbness/weakness of one side, or with any new, worsening, or concerning symptoms.

## 2021-04-23 NOTE — ED Notes (Signed)
Provider at bedside

## 2021-04-23 NOTE — ED Notes (Signed)
Pt ambulated to restroom without assistance at this time ?

## 2021-04-23 NOTE — ED Notes (Signed)
Returned from MRI at this time.

## 2021-04-23 NOTE — ED Notes (Signed)
Received call from MRI they are coming to get pt

## 2021-04-26 ENCOUNTER — Other Ambulatory Visit: Payer: Self-pay

## 2021-04-26 ENCOUNTER — Encounter: Payer: Self-pay | Admitting: Family Medicine

## 2021-04-26 ENCOUNTER — Ambulatory Visit (INDEPENDENT_AMBULATORY_CARE_PROVIDER_SITE_OTHER): Payer: Managed Care, Other (non HMO) | Admitting: Family Medicine

## 2021-04-26 ENCOUNTER — Ambulatory Visit
Admission: RE | Admit: 2021-04-26 | Discharge: 2021-04-26 | Disposition: A | Discharge status: Home / Self Care | Payer: Managed Care, Other (non HMO) | Source: Ambulatory Visit | Attending: Family Medicine | Admitting: Family Medicine

## 2021-04-26 VITALS — BP 154/102 | HR 101 | Temp 97.7°F | Ht 63.0 in | Wt 226.6 lb

## 2021-04-26 DIAGNOSIS — R5383 Other fatigue: Secondary | ICD-10-CM

## 2021-04-26 DIAGNOSIS — R600 Localized edema: Secondary | ICD-10-CM | POA: Diagnosis not present

## 2021-04-26 DIAGNOSIS — R0609 Other forms of dyspnea: Secondary | ICD-10-CM

## 2021-04-26 DIAGNOSIS — I10 Essential (primary) hypertension: Secondary | ICD-10-CM

## 2021-04-26 DIAGNOSIS — R0600 Dyspnea, unspecified: Secondary | ICD-10-CM

## 2021-04-26 DIAGNOSIS — E039 Hypothyroidism, unspecified: Secondary | ICD-10-CM

## 2021-04-26 DIAGNOSIS — H8110 Benign paroxysmal vertigo, unspecified ear: Secondary | ICD-10-CM

## 2021-04-26 LAB — POC COVID19 BINAXNOW: SARS Coronavirus 2 Ag: NEGATIVE

## 2021-04-26 NOTE — Progress Notes (Signed)
   Subjective:    Patient ID: Courtney Collins, female    DOB: Oct 07, 1960, 60 y.o.   MRN: [id number]  HPI Chief Complaint  Patient presents with   Hypertension    Went to ER on 09/08 and BP was 224/144-headache and dizziness   She is here with complaints of elevated BP for the past week. Today she has DOE which is new. Denies chest pain, orthopnea, LE edema.   States she was having severe headache and dizziness so she went to the ED. BP in the ED was very high 224/144.  Negative CT and MRI brain. Headache has resolved. BP improving but still elevated.   Hx of vertigo. States dizziness with position changes only and better  now than last week. Taking meclizine prn.   HTN-reports taking triamterene/HCTZ daily and she took 2 pills on her own the other day because of elevated.    Hypothyroidism -reports taking levothyroxine daily with no concerns.     Denies fever, chills, chest pain, abdominal pain, N/V/D, urinary symptoms, LE edema.  No leg or calf pain.   Denies personal or family history of DVT or bleeding disorder. No recent travel or immobilization.    Social history: lives with her sister and mother, 1 daughter, works at a [employer] in [employer].  Denies smoking, drinking alcohol, drug use    Review of Systems Pertinent positives and negatives in the history of present illness.     Objective:   Physical Exam BP (!) 154/102 (BP Location: Left Arm, Patient Position: Sitting, Cuff Size: Normal)   Pulse (!) 101   Temp 97.7 F (36.5 C)   Ht '5\' 3"'$  (1.6 m)   Wt 226 lb 9.6 oz (102.8 kg)   LMP 02/15/2018 (Approximate)   SpO2 98%   BMI 40.14 kg/m   Alert and in no distress. No sinus tenderness. Periorbital edema present with normal conjunctiva. EOMs intact. Tympanic membranes and canals are normal. Pharyngeal area is normal. Neck is supple without adenopathy or thyromegaly. Cardiac exam shows a regular sinus rhythm without murmurs or gallops. Lungs are clear to  auscultation. Extremities without edema. Skin is warm and dry. Normal speech, mood and memory. Negative Homan's.         Assessment & Plan:  Dyspnea on exertion - Plan: CBC with Differential/Platelet, Comprehensive metabolic panel, POC [id number] BinaxNow, Novel Coronavirus, NAA ([facility]), EKG 12-Lead, Brain natriuretic peptide, DG Chest 2 View  Fatigue, unspecified type - Plan: Comprehensive metabolic panel, POC [id number] BinaxNow, Novel Coronavirus, NAA ([facility]), EKG 12-Lead, DG Chest 2 View  Uncontrolled hypertension - Plan: EKG 12-Lead, DG Chest 2 View  Periorbital edema of both eyes - Plan: Brain natriuretic peptide  Acquired hypothyroidism - Plan: TSH  Benign paroxysmal vertigo, unspecified laterality  Rapid Covid and flu tests are negative. PCR sent.  EKG shows NSR and unremarkable. Read by Dr. Redmond School and myself.  Low risk for DVT or PE.  Advised her to avoid increasing her medications on her own. She will keep an eye on her BP but suspect this will improve once her symptoms improve.  Fatigue unclear. Check labs.  Screen for HF with labs and chest XR.  Follow up if worsening or go back to the ED.  I will follow up tomorrow

## 2021-04-26 NOTE — Patient Instructions (Addendum)
Go to [facility] for a chest X ray when you leave here. I will be in touch with your results.   If you develop worsening shortness of breath, chest pain or feel like your heart is racing or beating differently then go to the emergency department or call 911.   Continue taking your medication as prescribed and keep an eye on your blood pressure at home.   We will be in touch tomorrow.

## 2021-04-27 LAB — COMPREHENSIVE METABOLIC PANEL
ALT: 19 IU/L (ref 0–32)
AST: 13 IU/L (ref 0–40)
Albumin/Globulin Ratio: 2 (ref 1.2–2.2)
Albumin: 4.6 g/dL (ref 3.8–4.8)
Alkaline Phosphatase: 107 IU/L (ref 44–121)
BUN/Creatinine Ratio: 11 — ABNORMAL LOW (ref 12–28)
BUN: 10 mg/dL (ref 8–27)
Bilirubin Total: 0.8 mg/dL (ref 0.0–1.2)
CO2: 26 mmol/L (ref 20–29)
Calcium: 10 mg/dL (ref 8.7–10.3)
Chloride: 100 mmol/L (ref 96–106)
Creatinine, Ser: 0.91 mg/dL (ref 0.57–1.00)
Globulin, Total: 2.3 g/dL (ref 1.5–4.5)
Glucose: 78 mg/dL (ref 65–99)
Potassium: 4.4 mmol/L (ref 3.5–5.2)
Sodium: 141 mmol/L (ref 134–144)
Total Protein: 6.9 g/dL (ref 6.0–8.5)
eGFR: 72 mL/min/{1.73_m2} (ref >59)

## 2021-04-27 LAB — CBC WITH DIFFERENTIAL/PLATELET
Basophils Absolute: 0.1 10*3/uL (ref 0.0–0.2)
Basos: 1 %
EOS (ABSOLUTE): 0.3 10*3/uL (ref 0.0–0.4)
Eos: 2 %
Hematocrit: 45.7 % (ref 34.0–46.6)
Hemoglobin: 15.5 g/dL (ref 11.1–15.9)
Immature Grans (Abs): 0 10*3/uL (ref 0.0–0.1)
Immature Granulocytes: 0 %
Lymphocytes Absolute: 1.8 10*3/uL (ref 0.7–3.1)
Lymphs: 17 %
MCH: 29.7 pg (ref 26.6–33.0)
MCHC: 33.9 g/dL (ref 31.5–35.7)
MCV: 88 fL (ref 79–97)
Monocytes Absolute: 0.9 10*3/uL (ref 0.1–0.9)
Monocytes: 8 %
Neutrophils Absolute: 7.7 10*3/uL — ABNORMAL HIGH (ref 1.4–7.0)
Neutrophils: 72 %
Platelets: 421 10*3/uL (ref 150–450)
RBC: 5.22 x10E6/uL (ref 3.77–5.28)
RDW: 13.4 % (ref 11.7–15.4)
WBC: 10.8 10*3/uL (ref 3.4–10.8)

## 2021-04-27 LAB — SARS-COV-2, NAA 2 DAY TAT

## 2021-04-27 LAB — NOVEL CORONAVIRUS, NAA: SARS-CoV-2, NAA: NOT DETECTED

## 2021-04-27 LAB — TSH: TSH: 1.27 u[IU]/mL (ref 0.450–4.500)

## 2021-04-27 LAB — BRAIN NATRIURETIC PEPTIDE: BNP: 3.9 pg/mL (ref 0.0–100.0)

## 2021-04-27 NOTE — Progress Notes (Signed)
Labs are fine overall. Negative Covid test. Please see how she is feeling now.

## 2021-04-29 ENCOUNTER — Telehealth: Payer: Self-pay | Admitting: Family Medicine

## 2021-04-29 ENCOUNTER — Other Ambulatory Visit: Payer: Self-pay | Admitting: Family Medicine

## 2021-04-29 NOTE — Telephone Encounter (Signed)
Pt called and stated that her blood pressure is still elevated. It has been around 175/120 and the lowest its been is 154/100. She wants to see if her medication needs to be changed

## 2021-04-29 NOTE — Progress Notes (Signed)
   Subjective:    Patient ID: Courtney Collins, female    DOB: 17-Mar-1961, 60 y.o.   MRN: [id number]  HPI    Review of Systems     Objective:   Physical Exam        Assessment & Plan:

## 2021-05-01 ENCOUNTER — Other Ambulatory Visit: Payer: Self-pay | Admitting: Family Medicine

## 2021-05-05 ENCOUNTER — Other Ambulatory Visit: Payer: Self-pay

## 2021-05-05 ENCOUNTER — Ambulatory Visit (INDEPENDENT_AMBULATORY_CARE_PROVIDER_SITE_OTHER): Payer: Managed Care, Other (non HMO) | Admitting: Family Medicine

## 2021-05-05 ENCOUNTER — Encounter: Payer: Self-pay | Admitting: Family Medicine

## 2021-05-05 VITALS — BP 162/102 | HR 94 | Temp 98.1°F | Ht 63.0 in | Wt 224.4 lb

## 2021-05-05 DIAGNOSIS — I10 Essential (primary) hypertension: Secondary | ICD-10-CM

## 2021-05-05 DIAGNOSIS — R0600 Dyspnea, unspecified: Secondary | ICD-10-CM

## 2021-05-05 DIAGNOSIS — M79662 Pain in left lower leg: Secondary | ICD-10-CM

## 2021-05-05 DIAGNOSIS — R0609 Other forms of dyspnea: Secondary | ICD-10-CM

## 2021-05-05 DIAGNOSIS — Z23 Encounter for immunization: Secondary | ICD-10-CM

## 2021-05-05 LAB — D-DIMER, QUANTITATIVE: D-DIMER: <0.2 mg/L FEU (ref 0.00–0.49)

## 2021-05-05 MED ORDER — AMLODIPINE BESYLATE 5 MG PO TABS: 5.0000 mg | ORAL_TABLET | Freq: Every day | ORAL | 0 refills | Status: DC

## 2021-05-05 NOTE — Progress Notes (Signed)
   Subjective:    Patient ID: Courtney Collins, female    DOB: 1961-06-27, 60 y.o.   MRN: [id number]  HPI Chief Complaint  Patient presents with   Hypertension    Wants to discuss possibility of returning to work, has been out of work since September 9th for HTN, dizziness, and headache. No more dizziness or H/A, has SHOB at times with exertion.  ER wrote out for 04/24/21 and 04/25/21.     She is here to follow-up on uncontrolled hypertension and DOE.  She went to the emergency department 09/08 for dizziness , severe headache and elevated BP. States she was give an out of work note for 09/10-09/06/2021. States she has not returned to work and now thinks she may be able to go back.  Negative CT and MRI brain. Headache has resolved.  States she is taking MaxZide 25 daily.  Reports having elevated BP still at home and intermittent DOE, a couple of times per week.  States she had left calf pain a couple of days ago but no erythema, warmth or edema. Pain has since resolved.   Denies fever, chills, dizziness, headache, chest pain, palpitations, abdominal pain, N/V/D, urinary symptoms, LE edema.        Review of Systems Pertinent positives and negatives in the history of present illness.     Objective:   Physical Exam BP (!) 162/102 (BP Location: Left Arm, Patient Position: Sitting)   Pulse 94   Temp 98.1 F (36.7 C) (Oral)   Ht 5\' 3"  (1.6 m)   Wt 224 lb 6.4 oz (101.8 kg)   LMP 02/15/2018 (Approximate)   SpO2 97%   BMI 39.75 kg/m   Alert and oriented and in no distress. Cardiac exam shows a regular rhythm without murmurs or gallops. Lungs are clear to auscultation. LE without edema. Skin is warm and dry. Normal speech and mood.        Assessment & Plan:  Uncontrolled hypertension - Plan: Ambulatory referral to Cardiology, amLODipine (NORVASC) 5 MG tablet, EKG 12-Lead  Need for influenza vaccination - Plan: Flu Vaccine QUAD 6+ mos PF IM (Fluarix Quad PF)  Dyspnea on exertion  - Plan: Ambulatory referral to Cardiology, D-dimer, quantitative, EKG 12-Lead  Pain of left calf - Plan: D-dimer, quantitative  EKG- NSR, rate 86, PVCs, read by myself and Dr. Redmond School.   Wells criteria 0 and low risk for DVT or PE. Stat D-Dimer ordered. Suspicion is very low for PE.  Continue MaxZide 25 and add amlodipine 5 mg. Low sodium diet recommended.  Referral to cardiology.  Follow up if worsening or any new symptoms.  Discussed that I did not take her out of work but I will write a note stating she appears to be medically stable to return to work.

## 2021-05-05 NOTE — Progress Notes (Signed)
Please call her and let her know: Your lab result is normal so unlikely that you have a blood clot. As mentioned earlier, if you develop any chest pain, palpitations or worsening shortness of breath then you should go to the emergency department or call 911.

## 2021-05-06 ENCOUNTER — Encounter: Payer: Self-pay | Admitting: Family Medicine

## 2021-05-13 ENCOUNTER — Ambulatory Visit (HOSPITAL_COMMUNITY)
Admission: RE | Admit: 2021-05-13 | Payer: Managed Care, Other (non HMO) | Source: Ambulatory Visit | Attending: Family Medicine | Admitting: Family Medicine

## 2021-05-26 NOTE — Progress Notes (Signed)
Cardiology Office Note:    Date:  05/26/2021   ID:  Courtney Collins, DOB 07-08-1961, MRN [id number]  PCP:  Girtha Rm, PA-C   [facility] Providers Cardiologist:  None     Referring MD: Girtha Rm, PA-C   Chief Complaint Hypertension poorly controlled  History of Present Illness:    Courtney Collins is a 60 y.o. female with a hx of HTN, hypothyroid, vertigo referral to cardiology for blood pressure management   Courtney Collins saw her PA in September 2022 and described an episode of hypertension, headache on September 9th. In the ED, she was dizzy with a headache. She states BP went up to 224.  She woke up that day and it was high. She had an MRI which was negative. She has persistent hypertension. She states before September Bps were 120s. She denies persistent LH or dizziness. Her troponin was negative.Her blood pressure at home is 150/100 or higher. She takes maxzide. No smoking. No diagnosis of sleep apnea, no snoring. Her mother has hypertension. She has had some hand and leg cramps. 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 It can be 30 minutes or less. It's on the right side. It's mainly with exertion she bends over alot. Rest makes it better. She notes some swelling in the legs improved with raising her legs. No PND. Her renal function is normal. She takes atorvastatin 20 mg.  Family Hx: No family hx of heart disease. Mother is 31 has arthritis,  lymphoma, sisters also with lymphoma. Sister had hx of TIA  Surgical Hx: hysterectomy, rotator cuff repair  CVD Risk/Equivalent: HLD- On statin, LDL 86 HTN- yes PAD- no DMII- no Smoker- no Family hx- no hx of CVD  Past Medical History:  Diagnosis Date   Abnormal uterine bleeding    Arthritis    Fibroids    Hypertension    Hypothyroidism    Obesity (BMI 30-39.9)     Past Surgical History:  Procedure Laterality Date   CYSTOSCOPY N/A 05/23/2018   Procedure: CYSTOSCOPY;   Surgeon: Christophe Louis, MD;  Location: [facility];  Service: Gynecology;  Laterality: N/A;   NO PAST SURGERIES     ROBOTIC ASSISTED TOTAL HYSTERECTOMY WITH BILATERAL SALPINGO OOPHERECTOMY Bilateral 05/23/2018   Procedure: XI ROBOTIC ASSISTED TOTAL HYSTERECTOMY WITH BILATERAL SALPINGO OOPHORECTOMY;  Surgeon: Christophe Louis, MD;  Location: [facility];  Service: Gynecology;  Laterality: Bilateral;   SHOULDER OPEN ROTATOR CUFF REPAIR Right 03/09/2016   Procedure: OPEN RIGHT ROTATOR CUFF REPAIR WITH GRAFT AND ANCHORS;  Surgeon: Latanya Maudlin, MD;  Location: [facility];  Service: Orthopedics;  Laterality: Right;    Current Medications: No outpatient medications have been marked as taking for the 05/28/21 encounter (Appointment) with Janina Mayo, MD.     Allergies:   Patient has no known allergies.   Social History   Socioeconomic History   Marital status: Single    Spouse name: Not on file   Number of children: Not on file   Years of education: Not on file   Highest education level: Not on file  Occupational History   Not on file  Tobacco Use   Smoking status: Never   Smokeless tobacco: Never  Vaping Use   Vaping Use: Never used  Substance and Sexual Activity   Alcohol use: No   Drug use: No   Sexual activity: Never  Other Topics Concern   Not on file  Social History Narrative  Not on file   Social Determinants of Health   Financial Resource Strain: Not on file  Food Insecurity: Not on file  Transportation Needs: Not on file  Physical Activity: Not on file  Stress: Not on file  Social Connections: Not on file     Family History: The patient's family history includes Cancer in her maternal aunt and maternal uncle; Healthy in her father; Hypertension in her mother.  ROS:   Please see the history of present illness.     All other systems reviewed and are negative.  EKGs/Labs/Other Studies Reviewed:    The following studies were reviewed today:   EKG:    05/06/2021- NSR, infrequent  PVCs, Qtc 485 ms  04/26/2021- NSR QTC 482 ms  Prolonged Qtc (>454ms) but not high risk  Recent Labs: 04/26/2021: ALT 19; BNP 3.9; BUN 10; Creatinine, Ser 0.91; Hemoglobin 15.5; Platelets 421; Potassium 4.4; Sodium 141; TSH 1.270  Recent Lipid Panel    Component Value Date/Time   CHOL 160 09/03/2020 1459   TRIG 176 (H) 09/03/2020 1459   HDL 44 09/03/2020 1459   CHOLHDL 3.6 09/03/2020 1459   CHOLHDL 4.8 Ratio 09/08/2009 1839   VLDL 31 05/28/2010 0000   LDLCALC 86 09/03/2020 1459   LDLDIRECT 180 (H) 01/21/2010 1818     Risk Assessment/Calculations:     The 10-year ASCVD risk score (Arnett DK, et al., 2019) is: 12.5%   Values used to calculate the score:     Age: 1 years     Sex: Female     Is Non-Hispanic African American: Yes     Diabetic: No     Tobacco smoker: No     Systolic Blood Pressure: 440 mmHg     Is BP treated: Yes     HDL Cholesterol: 44 mg/dL     Total Cholesterol: 160 mg/dL     LDL 86  Physical Exam:    VS:  LMP 02/15/2018 (Approximate)     Wt Readings from Last 3 Encounters:  05/05/21 224 lb 6.4 oz (101.8 kg)  04/26/21 226 lb 9.6 oz (102.8 kg)  04/22/21 226 lb (102.5 kg)     GEN:  Well nourished, well developed in no acute distress HEENT: Normal NECK: No JVD; No carotid bruits LYMPHATICS: No lymphadenopathy CARDIAC: RRR, no murmurs, rubs, gallops RESPIRATORY:  Clear to auscultation without rales, wheezing or rhonchi  ABDOMEN: Soft, non-tender, non-distended MUSCULOSKELETAL:  reproducible pain by the right shoulder. No edema; No deformity  SKIN: Warm and dry NEUROLOGIC:  Alert and oriented x 3 PSYCHIATRIC:  Normal affect   ASSESSMENT:   #Musculoskeletal Chest Pain: Her pain was reproducible and she has a physical job, likely muscle related. She has shoulder pain as well. Her troponin was negative in the ED which was reassuring.  #CVD Risk: She is intermediate risk for CHD with ASCVD 12.5% she's on a statin. LDL at goal  #Poorly controlled  HTN: Bp 150s-180s.She has stage II HTN. Will increase her norvasc and start an ARB. Will stop Maxzide and plan to following below. If maxed out on 3 medications and BP still high will consider more secondary causes. She has no signs of pheochromocytoma. Venous duplex is pending.    PLAN:    In order of problems listed above:  Increase norvasc to 10 mg daily Start losartan 25 mg daily Stop maxzide Bmp in 2 weeks Pharmacy blood pressure monitoring clinic for referral Goal BP <130/80 mmHg Follow up in 6 months   Medication Adjustments/Labs  and Tests Ordered: Current medicines are reviewed at length with the patient today.  Concerns regarding medicines are outlined above.    Signed, Janina Mayo, MD  05/26/2021 10:47 AM    [facility]

## 2021-05-28 ENCOUNTER — Encounter: Payer: Self-pay | Admitting: Internal Medicine

## 2021-05-28 ENCOUNTER — Ambulatory Visit (INDEPENDENT_AMBULATORY_CARE_PROVIDER_SITE_OTHER): Payer: Managed Care, Other (non HMO) | Admitting: Internal Medicine

## 2021-05-28 ENCOUNTER — Other Ambulatory Visit: Payer: Self-pay

## 2021-05-28 VITALS — BP 206/100 | HR 88 | Ht 63.0 in | Wt 228.6 lb

## 2021-05-28 DIAGNOSIS — R0600 Dyspnea, unspecified: Secondary | ICD-10-CM | POA: Diagnosis not present

## 2021-05-28 DIAGNOSIS — I10 Essential (primary) hypertension: Secondary | ICD-10-CM | POA: Diagnosis not present

## 2021-05-28 MED ORDER — AMLODIPINE BESYLATE 10 MG PO TABS: 10.0000 mg | ORAL_TABLET | Freq: Every day | ORAL | 3 refills | Status: DC

## 2021-05-28 MED ORDER — LOSARTAN POTASSIUM 25 MG PO TABS: 25.0000 mg | ORAL_TABLET | Freq: Every day | ORAL | 3 refills | Status: DC

## 2021-05-28 NOTE — Patient Instructions (Addendum)
Medication Instructions:  TAKE AMLODIPINE 10 MG ONCE A DAY = 2 OF THE 5 MG TABLETS ONCE DAILY 2.   START LOSARTAN 25 MG ONCE A DAY *If you need a refill on your cardiac medications before your next appointment, please call your pharmacy*   Lab Work: TWO WEEKS If you have labs (blood work) drawn today and your tests are completely normal, you will receive your results only by: [place] (if you have MyChart) OR A paper copy in the mail If you have any lab test that is abnormal or we need to change your treatment, we will call you to review the results.   Follow-Up: At [facility], you and your health needs are our priority.  As part of our continuing mission to provide you with exceptional heart care, we have created designated Provider Care Teams.  These Care Teams include your primary Cardiologist (physician) and Advanced Practice Providers (APPs -  Physician Assistants and Nurse Practitioners) who all work together to provide you with the care you need, when you need it.  We recommend signing up for the patient portal called "MyChart".  Sign up information is provided on this After Visit Summary.  MyChart is used to connect with patients for Virtual Visits (Telemedicine).  Patients are able to view lab/test results, encounter notes, upcoming appointments, etc.  Non-urgent messages can be sent to your provider as well.   To learn more about what you can do with MyChart, go to [email or web address].    Your next appointment:   6 month(s)  The format for your next appointment:   In Person  Provider:  DR. Harl Bowie

## 2021-06-02 ENCOUNTER — Ambulatory Visit (HOSPITAL_COMMUNITY): Payer: Managed Care, Other (non HMO)

## 2021-06-17 LAB — BASIC METABOLIC PANEL
BUN/Creatinine Ratio: 18 (ref 12–28)
BUN: 12 mg/dL (ref 8–27)
CO2: 25 mmol/L (ref 20–29)
Calcium: 9.7 mg/dL (ref 8.7–10.3)
Chloride: 100 mmol/L (ref 96–106)
Creatinine, Ser: 0.66 mg/dL (ref 0.57–1.00)
Glucose: 110 mg/dL — ABNORMAL HIGH (ref 70–99)
Potassium: 4 mmol/L (ref 3.5–5.2)
Sodium: 140 mmol/L (ref 134–144)
eGFR: 100 mL/min/{1.73_m2} (ref >59)

## 2021-06-21 ENCOUNTER — Other Ambulatory Visit: Payer: Self-pay

## 2021-06-21 ENCOUNTER — Ambulatory Visit (HOSPITAL_COMMUNITY)
Admission: RE | Admit: 2021-06-21 | Discharge: 2021-06-21 | Disposition: A | Discharge status: Home / Self Care | Payer: Managed Care, Other (non HMO) | Source: Ambulatory Visit | Attending: Cardiology | Admitting: Cardiology

## 2021-06-21 DIAGNOSIS — I10 Essential (primary) hypertension: Secondary | ICD-10-CM

## 2021-06-23 ENCOUNTER — Emergency Department (HOSPITAL_COMMUNITY)
Admission: EM | Admit: 2021-06-23 | Discharge: 2021-06-23 | Disposition: A | Discharge status: Home / Self Care | Payer: Managed Care, Other (non HMO) | Attending: Emergency Medicine | Admitting: Emergency Medicine

## 2021-06-23 DIAGNOSIS — I10 Essential (primary) hypertension: Secondary | ICD-10-CM | POA: Diagnosis not present

## 2021-06-23 DIAGNOSIS — M545 Low back pain, unspecified: Secondary | ICD-10-CM | POA: Diagnosis present

## 2021-06-23 DIAGNOSIS — Z79899 Other long term (current) drug therapy: Secondary | ICD-10-CM | POA: Diagnosis not present

## 2021-06-23 DIAGNOSIS — E039 Hypothyroidism, unspecified: Secondary | ICD-10-CM | POA: Insufficient documentation

## 2021-06-23 DIAGNOSIS — M5441 Lumbago with sciatica, right side: Secondary | ICD-10-CM | POA: Diagnosis not present

## 2021-06-23 MED ORDER — PREDNISONE 20 MG PO TABS: ORAL_TABLET | ORAL | 0 refills | Status: DC

## 2021-06-23 MED ORDER — CYCLOBENZAPRINE HCL 10 MG PO TABS: 10.0000 mg | ORAL_TABLET | Freq: Two times a day (BID) | ORAL | 0 refills | Status: DC | PRN

## 2021-06-23 MED ORDER — KETOROLAC TROMETHAMINE 15 MG/ML IJ SOLN
15.0000 mg | Freq: Once | INTRAMUSCULAR | Status: AC
  Administered 2021-06-23: 15 mg via INTRAMUSCULAR
  Filled 2021-06-23: qty 1

## 2021-06-23 NOTE — ED Provider Notes (Signed)
[facility] EMERGENCY DEPARTMENT Provider Note   CSN: [id number] Arrival date & time: 06/23/21  1430     History Chief Complaint  Patient presents with   Back Pain    Courtney Collins is a 60 y.o. female.  She presents to the emergency department for evaluation of lower back pain.  She has a history of chronic back pain and "herniated disks".  She states that she gets a flare every several years.  She states that her back pain has been worse over the past 3 weeks.  She has had pain that goes from her lower back and buttocks into her right anterolateral thigh.  She has been taking over-the-counter medications such as ibuprofen, Aleve, and Tylenol not much improvement.  Pain is worse with certain positions and with movement. Patient denies warning symptoms of back pain including: fecal incontinence, urinary retention or overflow incontinence, night sweats, waking from sleep with back pain, unexplained fevers or weight loss, h/o cancer, IVDU, recent trauma.  No history of spinal injections.  She has seen Dr. Gladstone Lighter in the past for this.  Previous MRI (2018) as below.   Lumbar MRI (2018):  IMPRESSION: Small left foraminal protrusion at L3-4 contacts the exiting left L3 root without compression or displacement.   Mild central canal narrowing L4-5 where there is a shallow broad-based right paracentral protrusion and ligamentum flavum thickening. Facet degenerative change is present at this level with associated effusions.   Left worse than right facet arthropathy L5-S1. No disc bulge or protrusion at this level.      Past Medical History:  Diagnosis Date   Abnormal uterine bleeding    Arthritis    Fibroids    Hypertension    Hypothyroidism    Obesity (BMI 30-39.9)     Patient Active Problem List   Diagnosis Date Noted   S/P hysterectomy 05/23/2018   Complete tear of rotator cuff 03/09/2016   Edema 12/14/2011   Rash and nonspecific skin eruption  09/19/2011   Vertigo, benign paroxysmal 04/25/2011   Back muscle spasm 01/12/2011   Subacromial bursitis 11/25/2010   IRREGULAR MENSES 06/16/2010   Hypothyroidism 08/03/2007   Hyperlipidemia 10/12/2006   OBESITY, NOS 10/12/2006   HYPERTENSION, BENIGN SYSTEMIC 10/12/2006    Past Surgical History:  Procedure Laterality Date   CYSTOSCOPY N/A 05/23/2018   Procedure: CYSTOSCOPY;  Surgeon: Christophe Louis, MD;  Location: [facility];  Service: Gynecology;  Laterality: N/A;   NO PAST SURGERIES     ROBOTIC ASSISTED TOTAL HYSTERECTOMY WITH BILATERAL SALPINGO OOPHERECTOMY Bilateral 05/23/2018   Procedure: XI ROBOTIC ASSISTED TOTAL HYSTERECTOMY WITH BILATERAL SALPINGO OOPHORECTOMY;  Surgeon: Christophe Louis, MD;  Location: [facility];  Service: Gynecology;  Laterality: Bilateral;   SHOULDER OPEN ROTATOR CUFF REPAIR Right 03/09/2016   Procedure: OPEN RIGHT ROTATOR CUFF REPAIR WITH GRAFT AND ANCHORS;  Surgeon: Latanya Maudlin, MD;  Location: [facility];  Service: Orthopedics;  Laterality: Right;     OB History   No obstetric history on file.     Family History  Problem Relation Age of Onset   Hypertension Mother    Cancer Maternal Aunt    Cancer Maternal Uncle    Healthy Father     Social History   Tobacco Use   Smoking status: Never   Smokeless tobacco: Never  Vaping Use   Vaping Use: Never used  Substance Use Topics   Alcohol use: No   Drug use: No    Home Medications Prior to Admission medications  Medication Sig Start Date End Date Taking? Authorizing Provider  amLODipine (NORVASC) 10 MG tablet Take 1 tablet (10 mg total) by mouth daily. 05/28/21   Janina Mayo, MD  atorvastatin (LIPITOR) 20 MG tablet Take 1 tablet (20 mg total) by mouth daily. 10/15/20   Henson, Vickie L, PA-C  levothyroxine (SYNTHROID) 150 MCG tablet Take 1 tablet (150 mcg total) by mouth daily. 10/15/20   Henson, Vickie L, PA-C  losartan (COZAAR) 25 MG tablet Take 1 tablet (25 mg total) by mouth daily. 05/28/21 08/26/21  Janina Mayo, MD  meclizine (ANTIVERT) 25 MG tablet Take 1 tablet (25 mg total) by mouth 3 (three) times daily as needed for dizziness. 04/23/21   Caccavale, Sophia, PA-C  triamterene-hydrochlorothiazide (MAXZIDE-25) 37.5-25 MG tablet TAKE 1 TABLET DAILY (NEED APPOINTMENT FOR FURTHER REFILLS. WILL NOT REFILL WITHOUT APPOINTMENT BEING SCHEDULED) 01/25/21   Henson, Vickie L, PA-C    Allergies    Patient has no known allergies.  Review of Systems   Review of Systems  Constitutional:  Negative for fever and unexpected weight change.  Gastrointestinal:  Negative for constipation.       Negative for fecal incontinence.   Genitourinary:  Negative for dysuria, flank pain, hematuria and pelvic pain.       Negative for urinary incontinence or retention.  Musculoskeletal:  Positive for back pain.  Neurological:  Negative for weakness and numbness.       Denies saddle paresthesias.   Physical Exam Updated Vital Signs BP (!) 167/108 (BP Location: Left Arm)   Pulse 94   Temp 98.1 F (36.7 C)   Resp 18   LMP 02/15/2018 (Approximate)   SpO2 96%   Physical Exam Vitals and nursing note reviewed.  Constitutional:      Appearance: She is well-developed.  HENT:     Head: Normocephalic and atraumatic.  Eyes:     Conjunctiva/sclera: Conjunctivae normal.  Pulmonary:     Effort: Pulmonary effort is normal.  Abdominal:     Palpations: Abdomen is soft.     Tenderness: There is no abdominal tenderness.  Musculoskeletal:     Cervical back: Normal range of motion and neck supple. No tenderness. Normal range of motion.     Thoracic back: No tenderness.     Lumbar back: Tenderness present. Decreased range of motion.     Comments: No step-off noted with palpation of spine.  Patient winces when I palpate over the lower lumbar spine paraspinous musculature, especially on the right side including over the sacral area.  Skin:    General: Skin is warm and dry.     Findings: No rash.  Neurological:     Mental  Status: She is alert.     Sensory: No sensory deficit.     Deep Tendon Reflexes: Reflexes are normal and symmetric.     Comments: 5/5 strength in entire lower extremities bilaterally. No sensation deficit.     ED Results / Procedures / Treatments   Labs (all labs ordered are listed, but only abnormal results are displayed) Labs Reviewed - No data to display  EKG None  Radiology No results found.  Procedures Procedures   Medications Ordered in ED Medications - No data to display  ED Course  I have reviewed the triage vital signs and the nursing notes.  Pertinent labs & imaging results that were available during my care of the patient were reviewed by me and considered in my medical decision making (see chart for details).  4:51 PM Patient seen and examined.  We will give a dose of IM Toradol.  Will give prescription for Flexeril and tapered prednisone to try for home.  Encourage PCP/orthopedic follow-up.  Vital signs reviewed and are as follows: Vitals:   06/23/21 1454 06/23/21 1527  BP: (!) 167/108   Pulse: 94   Resp: 18   Temp:  98.1 F (36.7 C)  SpO2: 96%    5:14 PM IM toradol given. Plan to d/c.   No red flag s/s of low back pain. Patient was counseled on back pain precautions and told to do activity as tolerated but do not lift, push, or pull heavy objects more than 10 pounds for the next week.  Patient counseled to use ice or heat on back for no longer than 15 minutes every hour.   Patient counseled on proper use of muscle relaxant medication.  They were told not to drink alcohol, drive any vehicle, or do any dangerous activities while taking this medication.  Patient verbalized understanding.  Patient urged to follow-up with PCP for recheck. Urged to return with worsening severe pain, loss of bowel or bladder control, trouble walking.   The patient verbalizes understanding and agrees with the plan.    MDM Rules/Calculators/A&P                            Patient with back pain with radicular feature. No neurological deficits. Patient is ambulatory. No warning symptoms of back pain including: fecal incontinence, urinary retention or overflow incontinence, night sweats, waking from sleep with back pain, unexplained fevers or weight loss, h/o cancer, IVDU, recent trauma. No concern for cauda equina, epidural abscess, or other serious cause of back pain. Conservative measures such as rest, ice/heat and pain medicine indicated with PCP follow-up if no improvement with conservative management.   Final Clinical Impression(s) / ED Diagnoses Final diagnoses:  Acute right-sided low back pain with right-sided sciatica    Rx / DC Orders ED Discharge Orders          Ordered    cyclobenzaprine (FLEXERIL) 10 MG tablet  2 times daily PRN        06/23/21 1710    predniSONE (DELTASONE) 20 MG tablet        06/23/21 1710             Carlisle Cater, PA-C 06/23/21 1715    Godfrey Pick, MD 06/24/21 [phone]

## 2021-06-23 NOTE — Discharge Instructions (Signed)
Please read and follow all provided instructions.  Your diagnoses today include:  1. Acute right-sided low back pain with right-sided sciatica    Tests performed today include: Vital signs - see below for your results today  Medications prescribed:  Flexeril (cyclobenzaprine) - muscle relaxer medication  DO NOT drive or perform any activities that require you to be awake and alert because this medicine can make you drowsy.   Prednisone - steroid medicine   It is best to take this medication in the morning to prevent sleeping problems. If you are diabetic, monitor your blood sugar closely and stop taking Prednisone if blood sugar is over 300. Take with food to prevent stomach upset.   Home care instructions:  Follow any educational materials contained in this packet Please rest, use ice or heat on your back for the next several days Do not lift, push, pull anything more than 10 pounds for the next week  Follow-up instructions: Please follow-up with your primary care provider in the next 1 week for further evaluation of your symptoms.   Return instructions:  SEEK IMMEDIATE MEDICAL ATTENTION IF YOU HAVE: New numbness, tingling, weakness, or problem with the use of your arms or legs Severe back pain not relieved with medications Loss control of your bowels or bladder Increasing pain in any areas of the body (such as chest or abdominal pain) Shortness of breath, dizziness, or fainting.  Worsening nausea (feeling sick to your stomach), vomiting, fever, or sweats Any other emergent concerns regarding your health   Additional Information:  Your vital signs today were: BP (!) 167/108 (BP Location: Left Arm)   Pulse 94   Temp 98.1 F (36.7 C)   Resp 18   LMP 02/15/2018 (Approximate)   SpO2 96%  If your blood pressure (BP) was elevated above 135/85 this visit, please have this repeated by your doctor within one month. --------------

## 2021-06-23 NOTE — ED Triage Notes (Addendum)
Patient with history of herniated lumbar disc complains of lower back pain with radiation into left leg that started three weeks ago. Patient denies new incontinence Patient is ambulatory, pain is exacerbated by sitting.

## 2021-06-25 DIAGNOSIS — I10 Essential (primary) hypertension: Secondary | ICD-10-CM

## 2021-06-25 MED ORDER — AMLODIPINE BESYLATE 10 MG PO TABS: 10.0000 mg | ORAL_TABLET | Freq: Every day | ORAL | 3 refills | Status: DC

## 2021-06-25 MED ORDER — LOSARTAN POTASSIUM 25 MG PO TABS: 25.0000 mg | ORAL_TABLET | Freq: Every day | ORAL | 3 refills | Status: DC

## 2021-06-30 ENCOUNTER — Ambulatory Visit (INDEPENDENT_AMBULATORY_CARE_PROVIDER_SITE_OTHER): Payer: Managed Care, Other (non HMO) | Admitting: Pharmacist

## 2021-06-30 ENCOUNTER — Other Ambulatory Visit: Payer: Self-pay

## 2021-06-30 VITALS — BP 142/88 | HR 97 | Resp 16 | Ht 63.0 in | Wt 231.6 lb

## 2021-06-30 DIAGNOSIS — I10 Essential (primary) hypertension: Secondary | ICD-10-CM | POA: Diagnosis not present

## 2021-06-30 DIAGNOSIS — E785 Hyperlipidemia, unspecified: Secondary | ICD-10-CM

## 2021-06-30 LAB — BASIC METABOLIC PANEL
BUN/Creatinine Ratio: 21 (ref 12–28)
BUN: 15 mg/dL (ref 8–27)
CO2: 27 mmol/L (ref 20–29)
Calcium: 10.4 mg/dL — ABNORMAL HIGH (ref 8.7–10.3)
Chloride: 98 mmol/L (ref 96–106)
Creatinine, Ser: 0.72 mg/dL (ref 0.57–1.00)
Glucose: 103 mg/dL — ABNORMAL HIGH (ref 70–99)
Potassium: 4.5 mmol/L (ref 3.5–5.2)
Sodium: 141 mmol/L (ref 134–144)
eGFR: 95 mL/min/{1.73_m2} (ref >59)

## 2021-06-30 LAB — LIPID PANEL
Chol/HDL Ratio: 3.1 ratio (ref 0.0–4.4)
Cholesterol, Total: 184 mg/dL (ref 100–199)
HDL: 60 mg/dL (ref >39)
LDL Chol Calc (NIH): 106 mg/dL — ABNORMAL HIGH (ref 0–99)
Triglycerides: 99 mg/dL (ref 0–149)
VLDL Cholesterol Cal: 18 mg/dL (ref 5–40)

## 2021-06-30 NOTE — Patient Instructions (Addendum)
It was nice meeting you today  We would like your blood pressure to be less than 130/80  Continue your losartan 25mg , amlodipine 10mg , and metoprolol 25mg  once a day for now  We will check your lab results today and If normal, we can discuss increasing your losartan to 50mg  daily  We will also check your cholesterol today  We would like your LDL (bad cholesterol) to be less than 70  Continue to limit the amount of salt, caffeine, and processed foods in your diet  Try to increase your physical activity up to at least 30 minutes a day at least 5 days a week  Please give [place] a call with any questions!  Karren Cobble, PharmD, BCACP, [place], [place], [place], [place], [address] Phone: [phone], Fax: [phone]

## 2021-06-30 NOTE — Progress Notes (Signed)
Patient ID: [name]                 DOB: Courtney Collins                      MRN: [id number]     HPI: Courtney Collins is a 60 y.o. female referred by Dr. Harl Bowie to HTN clinic. PMH is significant for HTN, HLD, and obesity.  Seen by Dr Harl Bowie on 10/14 and Maxzide was d/c and patient was started on losartan.  Patient presents today reporting sciatica pain. Went to ED on 11/9 for pain and prescribed prednisone and cyclobenzaprine. Reports it has not been effective but it has unfortunately increased her BP.  Has been checking BP twice daily at home but did not bring machine or log. Thinks her last 2 home readings were 138/80 and 162/88.   When initially switched to losartan sh believes BP began decreasing however has increased again due to pain and steroids.    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  Reports she does not add salt to food.  Does not drink coffee.  Has one regular soda a day. Soda a day regular, no coffee. Does not drink alcohol or use tobacco.    Mother and father both had HTN.  Reports intermittent chest pain.  Current HTN meds:  Amlodipine 10mg  daily Losartan 25mg  daily Metoprolol succinate 25mg  (started by PCP) Previously tried: Maxzide 25 BP goal: <130/80   Wt Readings from Last 3 Encounters:  05/28/21 228 lb 9.6 oz (103.7 kg)  05/05/21 224 lb 6.4 oz (101.8 kg)  04/26/21 226 lb 9.6 oz (102.8 kg)   BP Readings from Last 3 Encounters:  06/23/21 140/83  05/28/21 (!) 206/100  05/05/21 (!) 162/102   Pulse Readings from Last 3 Encounters:  06/23/21 80  05/28/21 88  05/05/21 94    Renal function: CrCl cannot be calculated (Unknown ideal weight.).  Past Medical History:  Diagnosis Date   Abnormal uterine bleeding    Arthritis    Fibroids    Hypertension    Hypothyroidism    Obesity (BMI 30-39.9)     Current Outpatient Medications on File Prior to Visit  Medication Sig Dispense Refill   amLODipine  (NORVASC) 10 MG tablet Take 1 tablet (10 mg total) by mouth daily. 90 tablet 3   atorvastatin (LIPITOR) 20 MG tablet Take 1 tablet (20 mg total) by mouth daily. 90 tablet 0   cyclobenzaprine (FLEXERIL) 10 MG tablet Take 1 tablet (10 mg total) by mouth 2 (two) times daily as needed for muscle spasms. 14 tablet 0   levothyroxine (SYNTHROID) 150 MCG tablet Take 1 tablet (150 mcg total) by mouth daily. 90 tablet 0   losartan (COZAAR) 25 MG tablet Take 1 tablet (25 mg total) by mouth daily. 90 tablet 3   meclizine (ANTIVERT) 25 MG tablet Take 1 tablet (25 mg total) by mouth 3 (three) times daily as needed for dizziness. 10 tablet 0   predniSONE (DELTASONE) 20 MG tablet 3 Tabs PO Days 1-3, then 2 tabs PO Days 4-6, then 1 tab PO Day 7-9, then Half Tab PO Day 10-12 20 tablet 0   triamterene-hydrochlorothiazide (MAXZIDE-25) 37.5-25 MG tablet TAKE 1 TABLET DAILY (NEED APPOINTMENT FOR FURTHER REFILLS. WILL NOT REFILL WITHOUT APPOINTMENT BEING SCHEDULED) 90 tablet 1   No current facility-administered medications on file prior to visit.    No Known Allergies  Assessment/Plan:  1. Hypertension -  Patient BP in room 142/88 taken manually at end of appointment which is above goal of <130/80.  Possible contributing factors could be pain and prednisone.  Will likely need losartan increase however will have BMP drawn first.  Patient reports intermittent chest pain. Since patient is fasting will draw lipid panel this morning.  Courtney need atorvastatin increase as well.  Recommended reducing salt intake and processed foods.  Encouraged increasing physical activity up to at least 30 minutes a day at least 5 days a week.  Continue amlodipine 10mg  daily Continue losartan 25mg  daily Continue Toprol XL 25mg  daily Check BMP Check lipid panel Recheck in 4 weeks after labs come back.  Karren Cobble, PharmD, BCACP, [place], [place], [place], [place], [address] Phone: [phone], Fax: [phone]

## 2021-07-12 ENCOUNTER — Telehealth: Payer: Self-pay

## 2021-07-12 MED ORDER — ATORVASTATIN CALCIUM 20 MG PO TABS: 20.0000 mg | ORAL_TABLET | Freq: Every day | ORAL | 0 refills | Status: DC

## 2021-07-12 MED ORDER — ATORVASTATIN CALCIUM 20 MG PO TABS
20.0000 mg | ORAL_TABLET | Freq: Every day | ORAL | 0 refills | Status: DC
Start: 2021-07-12 — End: 2021-09-28

## 2021-07-12 NOTE — Telephone Encounter (Signed)
Patient needs to schedule a 3 month follow-up for CPE and BP. Please schedule this

## 2021-07-12 NOTE — Telephone Encounter (Signed)
Recv'd fax from [facility] req refill Atorvastatin 20mg  #90

## 2021-07-12 NOTE — Telephone Encounter (Signed)
done

## 2021-07-19 NOTE — Telephone Encounter (Signed)
Left message needs schedule appt

## 2021-08-24 ENCOUNTER — Telehealth: Payer: Self-pay | Admitting: Physician Assistant

## 2021-08-24 NOTE — Telephone Encounter (Signed)
Dismissal letter in guarantor snapshot  °

## 2021-09-28 ENCOUNTER — Encounter: Payer: Self-pay | Admitting: Family

## 2021-09-28 ENCOUNTER — Other Ambulatory Visit: Payer: Self-pay

## 2021-09-28 ENCOUNTER — Ambulatory Visit (INDEPENDENT_AMBULATORY_CARE_PROVIDER_SITE_OTHER): Payer: 59 | Admitting: Family

## 2021-09-28 VITALS — BP 178/81 | HR 91 | Temp 97.7°F | Ht 63.0 in | Wt 229.4 lb

## 2021-09-28 DIAGNOSIS — E039 Hypothyroidism, unspecified: Secondary | ICD-10-CM | POA: Diagnosis not present

## 2021-09-28 DIAGNOSIS — I10 Essential (primary) hypertension: Secondary | ICD-10-CM | POA: Diagnosis not present

## 2021-09-28 DIAGNOSIS — E782 Mixed hyperlipidemia: Secondary | ICD-10-CM

## 2021-09-28 MED ORDER — ATORVASTATIN CALCIUM 20 MG PO TABS: 20.0000 mg | ORAL_TABLET | Freq: Every day | ORAL | 0 refills | Status: DC

## 2021-09-28 MED ORDER — METOPROLOL SUCCINATE ER 25 MG PO TB24: 25.0000 mg | ORAL_TABLET | Freq: Every day | ORAL | 0 refills | Status: DC

## 2021-09-28 MED ORDER — LEVOTHYROXINE SODIUM 150 MCG PO TABS: 150.0000 ug | ORAL_TABLET | Freq: Every day | ORAL | 0 refills | Status: DC

## 2021-09-28 MED ORDER — OLMESARTAN MEDOXOMIL-HCTZ 40-25 MG PO TABS: 1.0000 | ORAL_TABLET | Freq: Every morning | ORAL | 0 refills | Status: DC

## 2021-09-28 NOTE — Assessment & Plan Note (Addendum)
Chronic - unstable - taking Losartan & Toprol, did not tolerate Amlodipine. pt advised to take an extra Losartan today only, then stop and start Olmesartan-HCTZ tomorrow. Continue Metoprolol, drink plenty of water and eat a low sodium diet.

## 2021-09-28 NOTE — Progress Notes (Signed)
New Patient Office Visit  Subjective:  Patient ID: Courtney Collins, female    DOB: 05-13-1961  Age: 61 y.o. MRN: [id number]  CC:  Chief Complaint  Patient presents with   Establish Care   Hypertension    Pt states that before her insurance termed, she had an allergic reaction to her medication. And she was also in the process of adjusting her dosage. She has still had elevated numbers.    Hyperlipidemia   Hypothyroidism    HPI Courtney Collins presents for establishing care and to discuss a few problems. Hypertension: Patient is currently maintained on the following medications for blood pressure: Losartan, Toprol Failed meds include Amlodipine - lower leg rash/cellulitis. Patient reports good compliance with blood pressure medications. Patient denies chest pain, headaches, shortness of breath or swelling. Last 3 blood pressure readings in our office are as follows: BP Readings from Last 3 Encounters:  09/28/21 (!) 178/81  06/30/21 (!) 142/88  06/23/21 140/83  Hyperlipidemia: Patient is currently maintained on the following medication for hyperlipidemia: Lipitor. Patient denies myalgia or other side effects. Patient reports good compliance with low fat/low cholesterol diet.  Last lipid panel as follows: Lab Results  Component Value Date   CHOL 184 06/30/2021   HDL 60 06/30/2021   LDLCALC 106 (H) 06/30/2021   LDLDIRECT 180 (H) 01/21/2010   TRIG 99 06/30/2021   CHOLHDL 3.1 06/30/2021  The 10-year ASCVD risk score (Arnett DK, et al., 2019) is: 16.1%   Values used to calculate the score:     Age: 68 years     Sex: Female     Is Non-Hispanic African American: Yes     Diabetic: No     Tobacco smoker: No     Systolic Blood Pressure: 812 mmHg     Is BP treated: Yes     HDL Cholesterol: 60 mg/dL     Total Cholesterol: 184 mg/dL  Hypothyroidism: Patient presents today for followup of Hypothyroidism.  Patient reports positive compliance with daily medication.  Patient  denies any of the following symptoms: fatigue, cold intolerance, constipation, weight gain or inability to lose weight, muscle weakness, mental slowing, dry hair and skin. Last TSH and free T4: Lab Results  Component Value Date   FREE T4 0.22 (L) 10/06/2008   TSH 1.270 04/26/2021   TSH 0.630 09/03/2020   TSH 0.422 01/24/2012      Past Medical History:  Diagnosis Date   Abnormal uterine bleeding    Arthritis    Back muscle spasm 01/12/2011   Fibroids    Hypertension    Hypothyroidism    Obesity (BMI 30-39.9)    Rash and nonspecific skin eruption 09/19/2011   Subacromial bursitis 11/25/2010   Better with steroid injection    Past Surgical History:  Procedure Laterality Date   CYSTOSCOPY N/A 05/23/2018   Procedure: CYSTOSCOPY;  Surgeon: Christophe Louis, MD;  Location: [facility];  Service: Gynecology;  Laterality: N/A;   NO PAST SURGERIES     ROBOTIC ASSISTED TOTAL HYSTERECTOMY WITH BILATERAL SALPINGO OOPHERECTOMY Bilateral 05/23/2018   Procedure: XI ROBOTIC ASSISTED TOTAL HYSTERECTOMY WITH BILATERAL SALPINGO OOPHORECTOMY;  Surgeon: Christophe Louis, MD;  Location: [facility];  Service: Gynecology;  Laterality: Bilateral;   SHOULDER OPEN ROTATOR CUFF REPAIR Right 03/09/2016   Procedure: OPEN RIGHT ROTATOR CUFF REPAIR WITH GRAFT AND ANCHORS;  Surgeon: Latanya Maudlin, MD;  Location: [facility];  Service: Orthopedics;  Laterality: Right;    Family History  Problem Relation Age of Onset  Hypertension Mother    Cancer Maternal Aunt    Cancer Maternal Uncle    Healthy Father     Social History   Socioeconomic History   Marital status: Single    Spouse name: Not on file   Number of children: Not on file   Years of education: Not on file   Highest education level: Not on file  Occupational History   Not on file  Tobacco Use   Smoking status: Never   Smokeless tobacco: Never  Vaping Use   Vaping Use: Never used  Substance and Sexual Activity   Alcohol use: No   Drug use: No   Sexual activity:  Never  Other Topics Concern   Not on file  Social History Narrative   Not on file   Social Determinants of Health   Financial Resource Strain: Not on file  Food Insecurity: Not on file  Transportation Needs: Not on file  Physical Activity: Not on file  Stress: Not on file  Social Connections: Not on file  Intimate Partner Violence: Not on file    Objective:   Today's Vitals: BP (!) 178/81    Pulse 91    Temp 97.7 F (36.5 C) (Temporal)    Ht 5\' 3"  (1.6 m)    Wt 229 lb 6.4 oz (104.1 kg)    LMP 02/15/2018 (Approximate)    SpO2 98%    BMI 40.64 kg/m   Physical Exam Vitals and nursing note reviewed.  Constitutional:      Appearance: Normal appearance. She is morbidly obese.  Cardiovascular:     Rate and Rhythm: Normal rate and regular rhythm.  Pulmonary:     Effort: Pulmonary effort is normal.     Breath sounds: Normal breath sounds.  Musculoskeletal:        General: Normal range of motion.  Skin:    General: Skin is warm and dry.  Neurological:     Mental Status: She is alert.  Psychiatric:        Mood and Affect: Mood normal.        Behavior: Behavior normal.    Assessment & Plan:   Problem List Items Addressed This Visit       Cardiovascular and Mediastinum   Essential hypertension    Chronic - unstable - taking Losartan & Toprol, did not tolerate Amlodipine. pt advised to take an extra Losartan today only, then stop and start Olmesartan-HCTZ tomorrow. Continue Metoprolol, drink plenty of water and eat a low sodium diet.      Relevant Medications   olmesartan-hydrochlorothiazide (BENICAR HCT) 40-25 MG tablet (Start on 09/29/2021)   atorvastatin (LIPITOR) 20 MG tablet   metoprolol succinate (TOPROL-XL) 25 MG 24 hr tablet     Endocrine   Hypothyroidism - Primary    Chronic - levothyroxine 152mcg qd      Relevant Medications   levothyroxine (SYNTHROID) 150 MCG tablet   metoprolol succinate (TOPROL-XL) 25 MG 24 hr tablet     Other   Hyperlipidemia     Chronic - stable on Lipitor      Relevant Medications   olmesartan-hydrochlorothiazide (BENICAR HCT) 40-25 MG tablet (Start on 09/29/2021)   atorvastatin (LIPITOR) 20 MG tablet   metoprolol succinate (TOPROL-XL) 25 MG 24 hr tablet   Morbid obesity ([place])    Wt. Loss strategies reviewed including portion control, less carbs including sweets, eating most of calories earlier in day, drinking 64oz water qd, and establishing daily exercise routine.  Outpatient Encounter Medications as of 09/28/2021  Medication Sig   cyclobenzaprine (FLEXERIL) 10 MG tablet Take 1 tablet (10 mg total) by mouth 2 (two) times daily as needed for muscle spasms.   [START ON 09/29/2021] olmesartan-hydrochlorothiazide (BENICAR HCT) 40-25 MG tablet Take 1 tablet by mouth in the morning.   [DISCONTINUED] atorvastatin (LIPITOR) 20 MG tablet Take 1 tablet (20 mg total) by mouth daily.   [DISCONTINUED] levothyroxine (SYNTHROID) 150 MCG tablet Take 1 tablet (150 mcg total) by mouth daily.   [DISCONTINUED] losartan (COZAAR) 25 MG tablet Take 1 tablet (25 mg total) by mouth daily.   [DISCONTINUED] metoprolol succinate (TOPROL-XL) 25 MG 24 hr tablet Take 25 mg by mouth daily.   atorvastatin (LIPITOR) 20 MG tablet Take 1 tablet (20 mg total) by mouth daily.   levothyroxine (SYNTHROID) 150 MCG tablet Take 1 tablet (150 mcg total) by mouth daily.   metoprolol succinate (TOPROL-XL) 25 MG 24 hr tablet Take 1 tablet (25 mg total) by mouth daily. OK to take in evening   [DISCONTINUED] amLODipine (NORVASC) 10 MG tablet Take 1 tablet (10 mg total) by mouth daily. (Patient not taking: Reported on 09/28/2021)   [DISCONTINUED] meclizine (ANTIVERT) 25 MG tablet Take 1 tablet (25 mg total) by mouth 3 (three) times daily as needed for dizziness. (Patient not taking: Reported on 09/28/2021)   [DISCONTINUED] predniSONE (DELTASONE) 20 MG tablet 3 Tabs PO Days 1-3, then 2 tabs PO Days 4-6, then 1 tab PO Day 7-9, then Half Tab PO Day 10-12   No  facility-administered encounter medications on file as of 09/28/2021.    Follow-up: Return in about 4 weeks (around 10/26/2021) for blood pressure.   Jeanie Sewer, NP

## 2021-09-28 NOTE — Assessment & Plan Note (Signed)
Wt. Loss strategies reviewed including portion control, less carbs including sweets, eating most of calories earlier in day, drinking 64oz water qd, and establishing daily exercise routine.

## 2021-09-28 NOTE — Assessment & Plan Note (Signed)
Chronic - stable on Lipitor

## 2021-09-28 NOTE — Assessment & Plan Note (Signed)
Chronic - levothyroxine 143mcg qd

## 2021-09-28 NOTE — Patient Instructions (Signed)
Welcome to [facility]! It was a pleasure meeting you today.  As discussed, I have sent your refills to your pharmacy. Please schedule a 1 month follow up visit today. For you high blood pressure take an extra Losartan today only and then stop this med and start the Olmesartan-HCTZ tomorrow and continue the Metoprolol, if you feel dizzy with both of these in the am, switch the Metoprolol to evenings. Remember to drink at least 8 cups of water daily and eat a low sodium diet.    PLEASE NOTE:  If you had any LAB tests please let us know if you have not heard back within a few days. You may see your results on MyChart before we have a chance to review them but we will give you a call once they are reviewed by [place]. If we ordered any REFERRALS today, please let us know if you have not heard from their office within the next week.  Let us know through MyChart if you are needing REFILLS, or have your pharmacy send [place] the request. You can also use MyChart to communicate with me or any office staff.  Please try these tips to maintain a healthy lifestyle:  Eat most of your calories during the day when you are active. Eliminate processed foods including packaged sweets (pies, cakes, cookies), reduce intake of potatoes, white bread, white pasta, and white rice. Look for whole grain options, oat flour or almond flour.  Each meal should contain half fruits/vegetables, one quarter protein, and one quarter carbs (no bigger than a computer mouse).  Cut down on sweet beverages. This includes juice, soda, and sweet tea. Also watch fruit intake, though this is a healthier sweet option, it still contains natural sugar! Limit to 3 servings daily.  Drink at least 1 glass of water with each meal and aim for at least 8 glasses per day  Exercise at least 150 minutes every week.

## 2021-10-11 ENCOUNTER — Other Ambulatory Visit: Payer: Self-pay | Admitting: Medical

## 2021-10-11 DIAGNOSIS — E782 Mixed hyperlipidemia: Secondary | ICD-10-CM

## 2021-10-26 ENCOUNTER — Ambulatory Visit (INDEPENDENT_AMBULATORY_CARE_PROVIDER_SITE_OTHER): Payer: 59 | Admitting: Family

## 2021-10-26 ENCOUNTER — Other Ambulatory Visit: Payer: Self-pay | Admitting: Family

## 2021-10-26 VITALS — BP 144/86 | HR 80 | Temp 98.5°F | Ht 63.0 in | Wt 226.6 lb

## 2021-10-26 DIAGNOSIS — I10 Essential (primary) hypertension: Secondary | ICD-10-CM

## 2021-10-26 MED ORDER — OLMESARTAN MEDOXOMIL-HCTZ 40-25 MG PO TABS: 1.0000 | ORAL_TABLET | Freq: Every morning | ORAL | 1 refills | Status: DC

## 2021-10-26 MED ORDER — OLMESARTAN MEDOXOMIL-HCTZ 40-25 MG PO TABS: 1.0000 | ORAL_TABLET | Freq: Every morning | ORAL | 0 refills | Status: DC

## 2021-10-26 NOTE — Progress Notes (Signed)
? ?Subjective:  ? ? ? Patient ID: Courtney Collins, female    DOB: 1960/08/22, 61 y.o.   MRN: [id number] ? ?Chief Complaint  ?Patient presents with  ? Follow-up  ?  Follow up for BP. No complaints  ? Hypertension  ?  No complaints ?  ? ?HPI: ?Hypertension: Patient is currently maintained on the following medications for blood pressure: Olmesartan-HCTZ, Metoprolol ?Failed meds include: Losartan, ineffective ?Patient reports good compliance with blood pressure medications. ?Patient denies chest pain, headaches, shortness of breath or swelling. ?Last 3 blood pressure readings in our office are as follows: ?BP Readings from Last 3 Encounters:  ?10/26/21 (!) 144/86  ?09/28/21 (!) 178/81  ?06/30/21 (!) 142/88  ? ? ? ?Health Maintenance Due  ?Topic Date Due  ? COVID-19 Vaccine (1) Never done  ? HIV Screening  Never done  ? Hepatitis C Screening  Never done  ? COLONOSCOPY (Pts 45-69yr Insurance coverage will need to be confirmed)  Never done  ? Zoster Vaccines- Shingrix (1 of 2) Never done  ? TETANUS/TDAP  03/19/2014  ? MAMMOGRAM  09/28/2020  ? PAP SMEAR-Modifier  01/31/2021  ? ? ?Past Medical History:  ?Diagnosis Date  ? Abnormal uterine bleeding   ? Arthritis   ? Back muscle spasm 01/12/2011  ? Fibroids   ? Hypertension   ? Hypothyroidism   ? Obesity (BMI 30-39.9)   ? Rash and nonspecific skin eruption 09/19/2011  ? Subacromial bursitis 11/25/2010  ? Better with steroid injection  ? ? ?Past Surgical History:  ?Procedure Laterality Date  ? CYSTOSCOPY N/A 05/23/2018  ? Procedure: CYSTOSCOPY;  Surgeon: CChristophe Louis MD;  Location: [facility];  Service: Gynecology;  Laterality: N/A;  ? NO PAST SURGERIES    ? ROBOTIC ASSISTED TOTAL HYSTERECTOMY WITH BILATERAL SALPINGO OOPHERECTOMY Bilateral 05/23/2018  ? Procedure: XI ROBOTIC ASSISTED TOTAL HYSTERECTOMY WITH BILATERAL SALPINGO OOPHORECTOMY;  Surgeon: CChristophe Louis MD;  Location: [facility];  Service: Gynecology;  Laterality: Bilateral;  ? SHOULDER OPEN ROTATOR CUFF REPAIR Right 03/09/2016  ?  Procedure: OPEN RIGHT ROTATOR CUFF REPAIR WITH GRAFT AND ANCHORS;  Surgeon: RLatanya Maudlin MD;  Location: [facility];  Service: Orthopedics;  Laterality: Right;  ? ? ?Outpatient Medications Prior to Visit  ?Medication Sig Dispense Refill  ? atorvastatin (LIPITOR) 20 MG tablet Take 1 tablet (20 mg total) by mouth daily. 90 tablet 0  ? cyclobenzaprine (FLEXERIL) 10 MG tablet Take 1 tablet (10 mg total) by mouth 2 (two) times daily as needed for muscle spasms. 14 tablet 0  ? levothyroxine (SYNTHROID) 150 MCG tablet Take 1 tablet (150 mcg total) by mouth daily. 90 tablet 0  ? metoprolol succinate (TOPROL-XL) 25 MG 24 hr tablet Take 1 tablet (25 mg total) by mouth daily. OK to take in evening 90 tablet 0  ? olmesartan-hydrochlorothiazide (BENICAR HCT) 40-25 MG tablet Take 1 tablet by mouth in the morning. 30 tablet 0  ? ?No facility-administered medications prior to visit.  ? ? ?No Known Allergies ? ? ?   ?Objective:  ?  ?Physical Exam ?Vitals and nursing note reviewed.  ?Constitutional:   ?   Appearance: Normal appearance.  ?Cardiovascular:  ?   Rate and Rhythm: Normal rate and regular rhythm.  ?Pulmonary:  ?   Effort: Pulmonary effort is normal.  ?   Breath sounds: Normal breath sounds.  ?Musculoskeletal:     ?   General: Normal range of motion.  ?Skin: ?   General: Skin is warm and dry.  ?Neurological:  ?  Mental Status: She is alert.  ?Psychiatric:     ?   Mood and Affect: Mood normal.     ?   Behavior: Behavior normal.  ? ? ?BP (!) 144/86   Pulse 80   Temp 98.5 ?F (36.9 ?C)   Ht '5\' 3"'$  (1.6 m)   Wt 226 lb 9.6 oz (102.8 kg)   LMP 02/15/2018 (Approximate)   SpO2 97%   BMI 40.14 kg/m?  ?Wt Readings from Last 3 Encounters:  ?10/26/21 226 lb 9.6 oz (102.8 kg)  ?09/28/21 229 lb 6.4 oz (104.1 kg)  ?06/30/21 231 lb 9.6 oz (105.1 kg)  ? ? ?   ?Assessment & Plan:  ? ?Problem List Items Addressed This Visit   ? ?  ? Cardiovascular and Mediastinum  ? Essential hypertension - Primary  ?  Chronic - still not at goal, pt has  lost a few lbs, tolerating Omesartan, will continue, check labs in 83mo, continue to advise on water intake & low sodium diet. ?  ?  ? Relevant Medications  ? olmesartan-hydrochlorothiazide (BENICAR HCT) 40-25 MG tablet  ? ? ?Meds ordered this encounter  ?Medications  ? olmesartan-hydrochlorothiazide (BENICAR HCT) 40-25 MG tablet  ?  Sig: Take 1 tablet by mouth in the morning.  ?  Dispense:  30 tablet  ?  Refill:  0  ?  Order Specific Question:   Supervising Provider  ?  Answer:   ANDY, CAMILLE L [[id number]]  ? ? ?HJeanie Sewer NP ? ?

## 2021-10-26 NOTE — Assessment & Plan Note (Addendum)
Chronic - still not at goal, pt has lost a few lbs, tolerating Omesartan, will continue, check labs in 9mo, continue to advise on water intake & low sodium diet. ?

## 2021-10-26 NOTE — Patient Instructions (Signed)
It was very nice to see you today! ? ?Continue taking your blood pressure medications daily as directed. ?Schedule a 3 mo. follow up visit with fasting labs.  ? ? ? ?PLEASE NOTE: ? ?If you had any lab tests please let us know if you have not heard back within a few days. You may see your results on MyChart before we have a chance to review them but we will give you a call once they are reviewed by [place]. If we ordered any referrals today, please let us know if you have not heard from their office within the next week.  ? ?Please try these tips to maintain a healthy lifestyle: ? ?Eat most of your calories during the day when you are active. Eliminate processed foods including packaged sweets (pies, cakes, cookies), reduce intake of potatoes, white bread, white pasta, and white rice. Look for whole grain options, oat flour or almond flour. ? ?Each meal should contain half fruits/vegetables, one quarter protein, and one quarter carbs (no bigger than a computer mouse). ? ?Cut down on sweet beverages. This includes juice, soda, and sweet tea. Also watch fruit intake, though this is a healthier sweet option, it still contains natural sugar! Limit to 3 servings daily. ? ?Drink at least 1 glass of water with each meal and aim for at least 8 glasses per day ? ?Exercise at least 150 minutes every week.  ? ?

## 2021-10-28 ENCOUNTER — Encounter: Payer: Self-pay | Admitting: Family

## 2021-11-29 NOTE — Telephone Encounter (Signed)
Patient is requesting olesartan hydrochlorothiazide to be sent to [facility] mail order.  Please give a call at [phone] once sent .

## 2021-11-30 ENCOUNTER — Ambulatory Visit: Payer: 59 | Admitting: Internal Medicine

## 2021-12-02 ENCOUNTER — Telehealth: Payer: Self-pay | Admitting: Internal Medicine

## 2021-12-02 NOTE — Telephone Encounter (Signed)
Patient states he changed insurance, and Dr. Harl Bowie isn't in network with his insurance.   ?

## 2021-12-08 ENCOUNTER — Other Ambulatory Visit: Payer: Self-pay

## 2021-12-08 ENCOUNTER — Telehealth: Payer: Self-pay

## 2021-12-08 DIAGNOSIS — E039 Hypothyroidism, unspecified: Secondary | ICD-10-CM

## 2021-12-08 DIAGNOSIS — E782 Mixed hyperlipidemia: Secondary | ICD-10-CM

## 2021-12-08 DIAGNOSIS — I10 Essential (primary) hypertension: Secondary | ICD-10-CM

## 2021-12-08 NOTE — Telephone Encounter (Signed)
Lvm for pt to call back in regards to correct pharmacy. ?

## 2021-12-08 NOTE — Telephone Encounter (Signed)
error 

## 2021-12-08 NOTE — Telephone Encounter (Signed)
Patient is requesting pharmacy to be updated to [facility].  States Olesartan-hydrochlorothiazide was sent to incorrect pharmacy.  Needs to be sent to [facility] order.  Is also requsting:  atorvastatin, levothyroxine, metoprolol succinate and cyclobenzaprine to be sent to [facility] order.

## 2021-12-10 ENCOUNTER — Other Ambulatory Visit: Payer: Self-pay

## 2021-12-10 DIAGNOSIS — I10 Essential (primary) hypertension: Secondary | ICD-10-CM

## 2021-12-10 DIAGNOSIS — E782 Mixed hyperlipidemia: Secondary | ICD-10-CM

## 2021-12-10 DIAGNOSIS — E039 Hypothyroidism, unspecified: Secondary | ICD-10-CM

## 2021-12-10 MED ORDER — METOPROLOL SUCCINATE ER 25 MG PO TB24: 25.0000 mg | ORAL_TABLET | Freq: Every day | ORAL | 0 refills | Status: DC

## 2021-12-10 MED ORDER — LEVOTHYROXINE SODIUM 150 MCG PO TABS: 150.0000 ug | ORAL_TABLET | Freq: Every day | ORAL | 0 refills | Status: DC

## 2021-12-10 MED ORDER — ATORVASTATIN CALCIUM 20 MG PO TABS: 20.0000 mg | ORAL_TABLET | Freq: Every day | ORAL | 0 refills | Status: DC

## 2021-12-10 MED ORDER — OLMESARTAN MEDOXOMIL-HCTZ 40-25 MG PO TABS: 1.0000 | ORAL_TABLET | Freq: Every morning | ORAL | 1 refills | Status: DC

## 2021-12-14 NOTE — Telephone Encounter (Signed)
Rx sent on 11/20/21 by Tyna Jaksch CMA ?

## 2022-01-26 ENCOUNTER — Ambulatory Visit: Payer: 59 | Admitting: Family

## 2022-02-02 ENCOUNTER — Ambulatory Visit: Payer: 59 | Admitting: Family

## 2022-02-03 ENCOUNTER — Ambulatory Visit (INDEPENDENT_AMBULATORY_CARE_PROVIDER_SITE_OTHER): Payer: 59 | Admitting: Family

## 2022-02-03 ENCOUNTER — Encounter: Payer: Self-pay | Admitting: Family

## 2022-02-03 VITALS — BP 167/97 | HR 84 | Temp 98.3°F | Ht 62.0 in | Wt 231.1 lb

## 2022-02-03 DIAGNOSIS — E039 Hypothyroidism, unspecified: Secondary | ICD-10-CM | POA: Diagnosis not present

## 2022-02-03 DIAGNOSIS — I10 Essential (primary) hypertension: Secondary | ICD-10-CM

## 2022-02-03 MED ORDER — METOPROLOL SUCCINATE ER 25 MG PO TB24: 50.0000 mg | ORAL_TABLET | Freq: Every day | ORAL | 0 refills | Status: DC

## 2022-02-03 NOTE — Progress Notes (Signed)
Subjective:     Patient ID: Courtney Collins, female    DOB: [birth year], 61 y.o.   MRN: [id number]  Chief Complaint  Patient presents with   Hypertension    Pt here for HTN w/ fasting labs   HPI: Hypertension: Patient is currently maintained on the following medications for blood pressure: Olmesartan-HCTZ, Metoprolol Failed meds include: Losartan - ineffective; Amlodipine - swelling, rash. Patient reports good compliance with blood pressure medications. Patient denies chest pain, headaches, shortness of breath or swelling. Last 3 blood pressure readings in our office are as follows: BP Readings from Last 3 Encounters:  02/03/22 (!) 167/97  10/26/21 (!) 144/86  09/28/21 (!) 178/81  Hypothyroidism: Patient presents today for followup of Hypothyroidism.  Patient reports positive compliance with daily medication.  Patient denies any of the following symptoms: fatigue, cold intolerance, constipation, weight gain or inability to lose weight, muscle weakness, mental slowing, dry hair and skin. Last TSH and free T4: Lab Results  Component Value Date   FREE T4 0.22 (L) 10/06/2008   TSH 1.270 04/26/2021   TSH 0.630 09/03/2020   TSH 0.422 01/24/2012      Assessment & Plan:   Problem List Items Addressed This Visit       Cardiovascular and Mediastinum   Essential hypertension    Chronic - unstable, BP still high, wt up 5lbs. - increasing Metoprolol to $RemoveBefor'50mg'[id number]$  qpm, continue Olmesartan-HCTZ qam, continue to advise on increasing water intake and eating low sodium diet.       Relevant Medications   metoprolol succinate (TOPROL-XL) 25 MG 24 hr tablet   Other Relevant Orders   Comp Met (CMET)   Lipid panel     Endocrine   Hypothyroidism - Primary   Relevant Medications   metoprolol succinate (TOPROL-XL) 25 MG 24 hr tablet   Other Relevant Orders   T4, free   TSH    Outpatient Medications Prior to Visit  Medication Sig Dispense Refill   atorvastatin (LIPITOR) 20 MG tablet  Take 1 tablet (20 mg total) by mouth daily. 90 tablet 0   levothyroxine (SYNTHROID) 150 MCG tablet Take 1 tablet (150 mcg total) by mouth daily. 90 tablet 0   olmesartan-hydrochlorothiazide (BENICAR HCT) 40-25 MG tablet Take 1 tablet by mouth in the morning. 90 tablet 1   metoprolol succinate (TOPROL-XL) 25 MG 24 hr tablet Take 1 tablet (25 mg total) by mouth daily. OK to take in evening 90 tablet 0   cyclobenzaprine (FLEXERIL) 10 MG tablet Take 1 tablet (10 mg total) by mouth 2 (two) times daily as needed for muscle spasms. (Patient not taking: Reported on 02/03/2022) 14 tablet 0   No facility-administered medications prior to visit.    Past Medical History:  Diagnosis Date   Abnormal uterine bleeding    Arthritis    Back muscle spasm 01/12/2011   Fibroids    Hypertension    Hypothyroidism    Obesity (BMI 30-39.9)    Rash and nonspecific skin eruption 09/19/2011   Subacromial bursitis 11/25/2010   Better with steroid injection    Past Surgical History:  Procedure Laterality Date   CYSTOSCOPY N/A 05/23/2018   Procedure: CYSTOSCOPY;  Surgeon: Christophe Louis, MD;  Location: [facility];  Service: Gynecology;  Laterality: N/A;   NO PAST SURGERIES     ROBOTIC ASSISTED TOTAL HYSTERECTOMY WITH BILATERAL SALPINGO OOPHERECTOMY Bilateral 05/23/2018   Procedure: XI ROBOTIC ASSISTED TOTAL HYSTERECTOMY WITH BILATERAL SALPINGO OOPHORECTOMY;  Surgeon: Christophe Louis, MD;  Location: [facility];  Service: Gynecology;  Laterality: Bilateral;   SHOULDER OPEN ROTATOR CUFF REPAIR Right 03/09/2016   Procedure: OPEN RIGHT ROTATOR CUFF REPAIR WITH GRAFT AND ANCHORS;  Surgeon: Latanya Maudlin, MD;  Location: [facility];  Service: Orthopedics;  Laterality: Right;    No Known Allergies     Objective:    Physical Exam Vitals and nursing note reviewed.  Constitutional:      Appearance: Normal appearance. She is obese.  Cardiovascular:     Rate and Rhythm: Normal rate and regular rhythm.  Pulmonary:     Effort: Pulmonary effort  is normal.     Breath sounds: Normal breath sounds.  Musculoskeletal:        General: Normal range of motion.     Right lower leg: No edema.     Left lower leg: No edema.  Skin:    General: Skin is warm and dry.  Neurological:     Mental Status: She is alert.  Psychiatric:        Mood and Affect: Mood normal.        Behavior: Behavior normal.    BP (!) 167/97 (BP Location: Left Arm, Patient Position: Sitting, Cuff Size: Large)   Pulse 84   Temp 98.3 F (36.8 C) (Temporal)   Ht $R'5\' 2"'NE$  (1.575 m)   Wt 231 lb 2 oz (104.8 kg)   LMP 02/15/2018 (Approximate)   SpO2 97%   BMI 42.27 kg/m  Wt Readings from Last 3 Encounters:  02/03/22 231 lb 2 oz (104.8 kg)  10/26/21 226 lb 9.6 oz (102.8 kg)  09/28/21 229 lb 6.4 oz (104.1 kg)      Meds ordered this encounter  Medications   metoprolol succinate (TOPROL-XL) 25 MG 24 hr tablet    Sig: Take 2 tablets (50 mg total) by mouth daily. OK to take in evening    Dispense:  90 tablet    Refill:  0    Order Specific Question:   Supervising Provider    Answer:   ANDY, CAMILLE L [[id number]]    Jeanie Sewer, NP

## 2022-02-03 NOTE — Patient Instructions (Signed)
It was very nice to see you today!   Go to lab for blood work today.  I will review results via MyChart in a few days.  Increase your Metoprolol to 2 pills every evening, as your blood pressure is still not at goal. Continue taking the Olmesartan-HCTZ in the mornings.  Have to work on getting in 2 liters of water daily, not just diet juices or sodas!  Follow up in 3 months.    PLEASE NOTE:  If you had any lab tests please let us know if you have not heard back within a few days. You may see your results on MyChart before we have a chance to review them but we will give you a call once they are reviewed by [place]. If we ordered any referrals today, please let us know if you have not heard from their office within the next week.

## 2022-02-03 NOTE — Assessment & Plan Note (Addendum)
Chronic - unstable, BP still high, wt up 5lbs. - increasing Metoprolol to '50mg'$  qpm, continue Olmesartan-HCTZ qam, continue to advise on increasing water intake and eating low sodium diet.

## 2022-02-04 LAB — COMPREHENSIVE METABOLIC PANEL
ALT: 36 U/L — ABNORMAL HIGH (ref 0–35)
AST: 29 U/L (ref 0–37)
Albumin: 4.7 g/dL (ref 3.5–5.2)
Alkaline Phosphatase: 91 U/L (ref 39–117)
BUN: 20 mg/dL (ref 6–23)
CO2: 30 mEq/L (ref 19–32)
Calcium: 10 mg/dL (ref 8.4–10.5)
Chloride: 102 mEq/L (ref 96–112)
Creatinine, Ser: 0.72 mg/dL (ref 0.40–1.20)
GFR: 89.51 mL/min (ref >60.00)
Glucose, Bld: 86 mg/dL (ref 70–99)
Potassium: 3.8 mEq/L (ref 3.5–5.1)
Sodium: 142 mEq/L (ref 135–145)
Total Bilirubin: 0.7 mg/dL (ref 0.2–1.2)
Total Protein: 7.7 g/dL (ref 6.0–8.3)

## 2022-02-04 LAB — TSH: TSH: 0.02 u[IU]/mL — ABNORMAL LOW (ref 0.35–5.50)

## 2022-02-04 LAB — LIPID PANEL
Cholesterol: 156 mg/dL (ref 0–200)
HDL: 45.4 mg/dL (ref >39.00)
LDL Cholesterol: 90 mg/dL (ref 0–99)
NonHDL: 110.41
Total CHOL/HDL Ratio: 3
Triglycerides: 102 mg/dL (ref 0.0–149.0)
VLDL: 20.4 mg/dL (ref 0.0–40.0)

## 2022-02-04 LAB — T4, FREE: Free T4: 1.35 ng/dL (ref 0.60–1.60)

## 2022-03-09 ENCOUNTER — Other Ambulatory Visit: Payer: Self-pay | Admitting: Family

## 2022-03-09 DIAGNOSIS — E039 Hypothyroidism, unspecified: Secondary | ICD-10-CM

## 2022-03-09 DIAGNOSIS — I10 Essential (primary) hypertension: Secondary | ICD-10-CM

## 2022-03-09 DIAGNOSIS — E782 Mixed hyperlipidemia: Secondary | ICD-10-CM

## 2022-04-19 ENCOUNTER — Encounter: Payer: Self-pay | Admitting: Family

## 2022-04-19 ENCOUNTER — Ambulatory Visit (INDEPENDENT_AMBULATORY_CARE_PROVIDER_SITE_OTHER): Payer: Managed Care, Other (non HMO) | Admitting: Family

## 2022-04-19 VITALS — BP 162/95 | HR 72 | Temp 98.2°F | Ht 62.0 in | Wt 232.2 lb

## 2022-04-19 DIAGNOSIS — M25512 Pain in left shoulder: Secondary | ICD-10-CM

## 2022-04-19 DIAGNOSIS — G8929 Other chronic pain: Secondary | ICD-10-CM

## 2022-04-19 MED ORDER — MELOXICAM 7.5 MG PO TABS: 7.5000 mg | ORAL_TABLET | Freq: Every day | ORAL | 0 refills | Status: DC

## 2022-04-19 NOTE — Progress Notes (Signed)
Patient ID: Courtney Collins, female    DOB: 1960-10-03, 61 y.o.   MRN: [id number]  Chief Complaint  Patient presents with   Shoulder Pain    Lt shoulder pain recurring, had MRI done before, was told router cuff.     HPI: Left shoulder pain:  reports having pain for years, used to see Dr Gladstone Lighter at [facility], has had steroid injections, but has not needed again until now. Reports taking a lot of Advil and Aleve recently due to pain. Pt reports working in [employer] and pain is decreasing her ability to do this.   Assessment & Plan:  1. Chronic left shoulder pain sending Mobic, advised on use & SE, do not take other NSAIDs with this, ok to take Tylenol. Advised on using heat and analgesic rubs/patches prn. Also sending referral to [facility].  - meloxicam (MOBIC) 7.5 MG tablet; Take 1-2 tablets (7.5-15 mg total) by mouth daily.  Dispense: 60 tablet; Refill: 0 - Ambulatory referral to Orthopedic Surgery   Subjective:    Outpatient Medications Prior to Visit  Medication Sig Dispense Refill   atorvastatin (LIPITOR) 20 MG tablet Take 1 tablet by mouth once daily 90 tablet 0   levothyroxine (SYNTHROID) 150 MCG tablet TAKE 1 TABLET BY MOUTH ONCE DAILY **PATIENT  NEED  APPT  FOR  ANY  FURTHER  REFILLS* 90 tablet 0   metoprolol succinate (TOPROL-XL) 25 MG 24 hr tablet TAKE 1 TABLET BY MOUTH ONCE DAILY IN THE EVENING 90 tablet 0   olmesartan-hydrochlorothiazide (BENICAR HCT) 40-25 MG tablet Take 1 tablet by mouth in the morning. 90 tablet 1   No facility-administered medications prior to visit.   Past Medical History:  Diagnosis Date   Abnormal uterine bleeding    Arthritis    Back muscle spasm 01/12/2011   Fibroids    Hypertension    Hypothyroidism    Obesity (BMI 30-39.9)    Rash and nonspecific skin eruption 09/19/2011   Subacromial bursitis 11/25/2010   Better with steroid injection   Past Surgical History:  Procedure Laterality Date   CYSTOSCOPY N/A 05/23/2018    Procedure: CYSTOSCOPY;  Surgeon: Christophe Louis, MD;  Location: [facility];  Service: Gynecology;  Laterality: N/A;   NO PAST SURGERIES     ROBOTIC ASSISTED TOTAL HYSTERECTOMY WITH BILATERAL SALPINGO OOPHERECTOMY Bilateral 05/23/2018   Procedure: XI ROBOTIC ASSISTED TOTAL HYSTERECTOMY WITH BILATERAL SALPINGO OOPHORECTOMY;  Surgeon: Christophe Louis, MD;  Location: [facility];  Service: Gynecology;  Laterality: Bilateral;   SHOULDER OPEN ROTATOR CUFF REPAIR Right 03/09/2016   Procedure: OPEN RIGHT ROTATOR CUFF REPAIR WITH GRAFT AND ANCHORS;  Surgeon: Latanya Maudlin, MD;  Location: [facility];  Service: Orthopedics;  Laterality: Right;   No Known Allergies    Objective:    Physical Exam Vitals and nursing note reviewed.  Constitutional:      Appearance: Normal appearance. She is obese.  Cardiovascular:     Rate and Rhythm: Normal rate and regular rhythm.  Pulmonary:     Effort: Pulmonary effort is normal.     Breath sounds: Normal breath sounds.  Musculoskeletal:     Left shoulder: No swelling or bony tenderness. Decreased range of motion.  Skin:    General: Skin is warm and dry.  Neurological:     Mental Status: She is alert.  Psychiatric:        Mood and Affect: Mood normal.        Behavior: Behavior normal.    BP (!) 162/95  Pulse 72   Temp 98.2 F (36.8 C)   Ht '5\' 2"'$  (1.575 m)   Wt 232 lb 3.2 oz (105.3 kg)   LMP 02/15/2018 (Approximate)   SpO2 97%   BMI 42.47 kg/m  Wt Readings from Last 3 Encounters:  04/19/22 232 lb 3.2 oz (105.3 kg)  02/03/22 231 lb 2 oz (104.8 kg)  10/26/21 226 lb 9.6 oz (102.8 kg)       Jeanie Sewer, NP

## 2022-04-19 NOTE — Patient Instructions (Signed)
It was very nice to see you today!   I have sent over an anti-inflammatory medicine to take for your shoulder pain, and I also sent a referral to our Orthopedic office to get evaluated. They will order updated imaging if needed and can possibly give you a steroid injection.      PLEASE NOTE:  If you had any lab tests please let us know if you have not heard back within a few days. You may see your results on MyChart before we have a chance to review them but we will give you a call once they are reviewed by [place]. If we ordered any referrals today, please let us know if you have not heard from their office within the next week.

## 2022-04-22 ENCOUNTER — Ambulatory Visit (INDEPENDENT_AMBULATORY_CARE_PROVIDER_SITE_OTHER): Payer: Managed Care, Other (non HMO) | Admitting: Sports Medicine

## 2022-04-22 ENCOUNTER — Ambulatory Visit: Payer: Self-pay

## 2022-04-22 ENCOUNTER — Encounter: Payer: Self-pay | Admitting: Sports Medicine

## 2022-04-22 ENCOUNTER — Ambulatory Visit (INDEPENDENT_AMBULATORY_CARE_PROVIDER_SITE_OTHER): Payer: Managed Care, Other (non HMO)

## 2022-04-22 VITALS — BP 140/85 | HR 64 | Ht 63.0 in | Wt 231.4 lb

## 2022-04-22 DIAGNOSIS — G8929 Other chronic pain: Secondary | ICD-10-CM

## 2022-04-22 DIAGNOSIS — M19012 Primary osteoarthritis, left shoulder: Secondary | ICD-10-CM | POA: Diagnosis not present

## 2022-04-22 DIAGNOSIS — M25512 Pain in left shoulder: Secondary | ICD-10-CM | POA: Diagnosis not present

## 2022-04-22 MED ORDER — MELOXICAM 7.5 MG PO TABS: 15.0000 mg | ORAL_TABLET | Freq: Every day | ORAL | 0 refills | Status: DC

## 2022-04-22 NOTE — Progress Notes (Signed)
Chronic left shoulder pain. Pain increased when she started her new job. History of cortisone injections in the past; great relief. New job entails her to do a lot of repetitive motions which seems to be aggravating it more.

## 2022-04-22 NOTE — Progress Notes (Addendum)
Courtney Collins - 61 y.o. female MRN [id number]  Date of birth: 06-Oct-1960  Office Visit Note: Visit Date: 04/22/2022 PCP: Jeanie Sewer, NP Referred by: Jeanie Sewer, NP  Subjective: Chief Complaint  Patient presents with   Left Shoulder - Pain   HPI: Courtney Collins "[name]" is a pleasant 61 y.o. female who presents today for for acute on chronic left anterior shoulder pain.  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  She has to move them out to the side putting her arms in an abducted position.  Few weeks after starting this she noticed an aggravation of her left shoulder pain.  She has been icing and taking some ibuprofen as needed.  She saw her PCP and they did give her meloxicam which she takes as well, these do help to some extent.  The pain is fairly sharp with certain activity such as reaching overhead or across her body.  She denies any numbness tingling or radiation down the arm.  No redness, no fever or chills.  Pertinent ROS were reviewed with the patient and found to be negative unless otherwise specified above in HPI.   Assessment & Plan: Visit Diagnoses:  1. Chronic left shoulder pain   2. Arthritis of left acromioclavicular joint     Plan: Discussed with [name] that her left shoulder pain is likely coming from her [facility] joint.  This is likely exacerbated recently by the change in her work with a lot of repetitive motion at work of the upper extremities.  I did discuss with her modifying activity such as keeping her arm slightly flexed and closer to her body instead of extended.  Minimize overhead activity as well.  She has been trying some conservative measures such as NSAIDs and ice without much relief.  Through shared decision making, elected to proceed with ultrasound-guided [facility]  joint injection.  She tolerated this well and 10 minutes postinjection had significant improvement in pain and range of motion.  She may use ice and/or NSAIDs for any postinjection pain.  I did modify her meloxicam to be taken as 15 mg once daily as needed with food.  She will follow-up on an as-needed basis.  Follow-up: PRN   Meds & Orders:  Meds ordered this encounter  Medications   meloxicam (MOBIC) 7.5 MG tablet    Sig: Take 2 tablets (15 mg total) by mouth daily.    Dispense:  60 tablet    Refill:  0    Orders Placed This Encounter  Procedures   XR Shoulder Left   US Guided Needle Placement - No Linked Charges    Procedures: US-guided [facility] Joint injection, left shoulder After discussion on risks/benefits/indications, informed verbal consent was obtained. A timeout was then performed. The patient was seated in examination room. The area overlying the [facility] joint of the shoulder was prepped with Betadine and alcohol swab then utilizing ultrasound guidance, patient's AC joint was injected using a 25G, 1.5" needle with 0.5:1.77m lidocaine:depomedrol of injectate via an out-of-plane, walk-down approach. Visualization of injectate flow was noted under ultrasound guidance. Patient tolerated the procedure well without immediate complications.  -Evaluated patient about 10 minutes postinjection and he she had marked improvement in shoulder pain following       Clinical History: No specialty comments available.  She reports that  she has never smoked. She has never used smokeless tobacco. No results for input(s): "HGBA1C", "LABURIC" in the last 8760 hours.  Objective:   Vital Signs: BP (!) 140/85   Pulse 64   Ht '5\' 3"'$  (1.6 m)   Wt 231 lb 6.4 oz (105 kg)   LMP 02/15/2018 (Approximate)   BMI 40.99 kg/m   Physical Exam  Gen: Well-appearing, in no acute distress; non-toxic CV: Regular Rate. Well-perfused. Warm.  Resp: Breathing unlabored on room air; no wheezing. Psych: Fluid speech in  conversation; appropriate affect; normal thought process Neuro: Sensation intact throughout. No gross coordination deficits.   Ortho Exam - Left shoulder: There is tenderness to palpation noted over the left AC joint.  No overlying skin changes, erythema.  There is no tube bony tenderness over the acromion, scapula or humerus.  There is equivocal active and passive range of motion of 160 degrees in flexion, 145 degrees abduction as well as preserved internal and external rotation.  Strength is 5/5 throughout with an intact rotator cuff testing.  Empty can test negative. + Scarf's and Cross arm adduction test.  Imaging: XR Shoulder Left  Result Date: 04/22/2022 4 views of the left shoulder including AP, Grashey, scapular Y and axial views were ordered and reviewed by myself.  There is only mild glenohumeral joint arthritis.  There is moderate-severe AC joint arthritic change.  There is also some notable inferior spurring off the edge of the acromion.  No acute fracture or otherwise bony abnormality noted.   Past Medical/Family/Surgical/Social History: Medications & Allergies reviewed per EMR, new medications updated. Patient Active Problem List   Diagnosis Date Noted   S/P hysterectomy 05/23/2018   Edema 12/14/2011   Vertigo, benign paroxysmal 04/25/2011   Hypothyroidism 08/03/2007   Hyperlipidemia 10/12/2006   Morbid obesity ([place]) 10/12/2006   Essential hypertension 10/12/2006   Past Medical History:  Diagnosis Date   Abnormal uterine bleeding    Arthritis    Back muscle spasm 01/12/2011   Fibroids    Hypertension    Hypothyroidism    Obesity (BMI 30-39.9)    Rash and nonspecific skin eruption 09/19/2011   Subacromial bursitis 11/25/2010   Better with steroid injection   Family History  Problem Relation Age of Onset   Hypertension Mother    Cancer Maternal Aunt    Cancer Maternal Uncle    Healthy Father    Past Surgical History:  Procedure Laterality Date   CYSTOSCOPY N/A  05/23/2018   Procedure: CYSTOSCOPY;  Surgeon: Christophe Louis, MD;  [facility]: [facility];  Service: Gynecology;  Laterality: N/A;   NO PAST SURGERIES     ROBOTIC ASSISTED TOTAL HYSTERECTOMY WITH BILATERAL SALPINGO OOPHERECTOMY Bilateral 05/23/2018   Procedure: XI ROBOTIC ASSISTED TOTAL HYSTERECTOMY WITH BILATERAL SALPINGO OOPHORECTOMY;  Surgeon: Christophe Louis, MD;  Location: [facility];  Service: Gynecology;  Laterality: Bilateral;   SHOULDER OPEN ROTATOR CUFF REPAIR Right 03/09/2016   Procedure: OPEN RIGHT ROTATOR CUFF REPAIR WITH GRAFT AND ANCHORS;  Surgeon: Latanya Maudlin, MD;  Location[facility];  Service: Orthopedics;  Laterality: Right;   Social History   Occupational History   Not on file  Tobacco Use   Smoking status: Never   Smokeless tobacco: Never  Vaping Use   Vaping Use: Never used  Substance and Sexual Activity   Alcohol use: No   Drug use: No   Sexual activity: Never

## 2022-05-06 ENCOUNTER — Ambulatory Visit: Payer: 59 | Admitting: Family

## 2022-06-20 ENCOUNTER — Other Ambulatory Visit: Payer: Self-pay

## 2022-06-20 ENCOUNTER — Telehealth: Payer: Self-pay | Admitting: Family

## 2022-06-20 DIAGNOSIS — E782 Mixed hyperlipidemia: Secondary | ICD-10-CM

## 2022-06-20 DIAGNOSIS — I10 Essential (primary) hypertension: Secondary | ICD-10-CM

## 2022-06-20 DIAGNOSIS — E039 Hypothyroidism, unspecified: Secondary | ICD-10-CM

## 2022-06-20 MED ORDER — ATORVASTATIN CALCIUM 20 MG PO TABS: 20.0000 mg | ORAL_TABLET | Freq: Every day | ORAL | 0 refills | Status: DC

## 2022-06-20 MED ORDER — METOPROLOL SUCCINATE ER 25 MG PO TB24: 25.0000 mg | ORAL_TABLET | Freq: Every evening | ORAL | 0 refills | Status: DC

## 2022-06-20 MED ORDER — OLMESARTAN MEDOXOMIL-HCTZ 40-25 MG PO TABS: 1.0000 | ORAL_TABLET | Freq: Every morning | ORAL | 1 refills | Status: DC

## 2022-06-20 MED ORDER — LEVOTHYROXINE SODIUM 150 MCG PO TABS: ORAL_TABLET | ORAL | 0 refills | Status: DC

## 2022-06-20 NOTE — Telephone Encounter (Signed)
RX Sent. 

## 2022-06-20 NOTE — Telephone Encounter (Signed)
Please note new Preferred Pharmacy listed below and also Patient requests all RX's be 90 day supply:  LAST APPOINTMENT DATE:  Please schedule appointment if longer than 1 year  04/19/22  NEXT APPOINTMENT DATE:  MEDICATION: atorvastatin (LIPITOR) 20 MG tablet  AND  levothyroxine (SYNTHROID) 150 MCG tablet  AND  metoprolol succinate (TOPROL-XL) 25 MG 24 hr tablet  AND  olmesartan-hydrochlorothiazide (BENICAR HCT) 40-25 MG tablet  Is the patient out of medication? No approx 5 days left  PHARMACY:  [facility]  Let patient know to contact pharmacy at the end of the day to make sure medication is ready.  Please notify patient to allow 48-72 hours to process

## 2022-08-29 ENCOUNTER — Other Ambulatory Visit: Payer: Self-pay | Admitting: Family

## 2022-08-29 DIAGNOSIS — I10 Essential (primary) hypertension: Secondary | ICD-10-CM

## 2022-08-29 MED ORDER — METOPROLOL SUCCINATE ER 25 MG PO TB24: 25.0000 mg | ORAL_TABLET | Freq: Every evening | ORAL | 0 refills | Status: DC

## 2022-09-02 ENCOUNTER — Telehealth: Payer: Self-pay | Admitting: Family

## 2022-09-02 NOTE — Telephone Encounter (Signed)
  Encourage patient to contact the pharmacy for refills or they can request refills through [place]:  Please schedule appointment if longer than 1 year  NEXT APPOINTMENT DATE:  MEDICATION:  metoprolol succinate (TOPROL-XL) 25 MG 24 hr tablet   Is the patient out of medication? Yes  PHARMACY:  [facility] Phone: [phone]  Fax: [phone]      Let patient know to contact pharmacy at the end of the day to make sure medication is ready.  Please notify patient to allow 48-72 hours to process    PT NEEDS THIS MED TO BE SENT TO THE ABOVE PHARMACY THIS ON TIME AND THE REST OF THE TIME TO BE SENT TO THE MAIL ORDER PHARMACY.

## 2022-09-26 ENCOUNTER — Emergency Department (HOSPITAL_COMMUNITY)
Admission: EM | Admit: 2022-09-26 | Discharge: 2022-09-26 | Disposition: A | Discharge status: Home / Self Care | Payer: BLUE CROSS/BLUE SHIELD | Attending: Student | Admitting: Student

## 2022-09-26 ENCOUNTER — Encounter (HOSPITAL_COMMUNITY): Payer: Self-pay

## 2022-09-26 ENCOUNTER — Telehealth: Payer: Self-pay | Admitting: Family

## 2022-09-26 ENCOUNTER — Other Ambulatory Visit: Payer: Self-pay

## 2022-09-26 DIAGNOSIS — K921 Melena: Secondary | ICD-10-CM

## 2022-09-26 DIAGNOSIS — D72829 Elevated white blood cell count, unspecified: Secondary | ICD-10-CM | POA: Diagnosis not present

## 2022-09-26 DIAGNOSIS — E039 Hypothyroidism, unspecified: Secondary | ICD-10-CM | POA: Insufficient documentation

## 2022-09-26 DIAGNOSIS — I10 Essential (primary) hypertension: Secondary | ICD-10-CM | POA: Insufficient documentation

## 2022-09-26 DIAGNOSIS — R195 Other fecal abnormalities: Secondary | ICD-10-CM | POA: Insufficient documentation

## 2022-09-26 DIAGNOSIS — Z79899 Other long term (current) drug therapy: Secondary | ICD-10-CM | POA: Diagnosis not present

## 2022-09-26 LAB — CBC WITH DIFFERENTIAL/PLATELET
Abs Immature Granulocytes: 0.04 10*3/uL (ref 0.00–0.07)
Basophils Absolute: 0.1 10*3/uL (ref 0.0–0.1)
Basophils Relative: 1 %
Eosinophils Absolute: 0.2 10*3/uL (ref 0.0–0.5)
Eosinophils Relative: 2 %
HCT: 41.1 % (ref 36.0–46.0)
Hemoglobin: 14.4 g/dL (ref 12.0–15.0)
Immature Granulocytes: 0 %
Lymphocytes Relative: 13 %
Lymphs Abs: 1.5 10*3/uL (ref 0.7–4.0)
MCH: 30.6 pg (ref 26.0–34.0)
MCHC: 35 g/dL (ref 30.0–36.0)
MCV: 87.3 fL (ref 80.0–100.0)
Monocytes Absolute: 0.8 10*3/uL (ref 0.1–1.0)
Monocytes Relative: 7 %
Neutro Abs: 8.5 10*3/uL — ABNORMAL HIGH (ref 1.7–7.7)
Neutrophils Relative %: 77 %
Platelets: 405 10*3/uL — ABNORMAL HIGH (ref 150–400)
RBC: 4.71 MIL/uL (ref 3.87–5.11)
RDW: 13.2 % (ref 11.5–15.5)
WBC: 11.2 10*3/uL — ABNORMAL HIGH (ref 4.0–10.5)
nRBC: 0 % (ref 0.0–0.2)

## 2022-09-26 LAB — PROTIME-INR
INR: 1 (ref 0.8–1.2)
Prothrombin Time: 12.6 seconds (ref 11.4–15.2)

## 2022-09-26 LAB — COMPREHENSIVE METABOLIC PANEL
ALT: 46 U/L — ABNORMAL HIGH (ref 0–44)
AST: 39 U/L (ref 15–41)
Albumin: 4.3 g/dL (ref 3.5–5.0)
Alkaline Phosphatase: 74 U/L (ref 38–126)
Anion gap: 11 (ref 5–15)
BUN: 13 mg/dL (ref 8–23)
CO2: 26 mmol/L (ref 22–32)
Calcium: 9.5 mg/dL (ref 8.9–10.3)
Chloride: 103 mmol/L (ref 98–111)
Creatinine, Ser: 0.86 mg/dL (ref 0.44–1.00)
GFR, Estimated: >60 mL/min (ref >60)
Glucose, Bld: 120 mg/dL — ABNORMAL HIGH (ref 70–99)
Potassium: 4.1 mmol/L (ref 3.5–5.1)
Sodium: 140 mmol/L (ref 135–145)
Total Bilirubin: 0.6 mg/dL (ref 0.3–1.2)
Total Protein: 7.4 g/dL (ref 6.5–8.1)

## 2022-09-26 LAB — POC OCCULT BLOOD, ED: Fecal Occult Bld: NEGATIVE

## 2022-09-26 NOTE — Telephone Encounter (Signed)
PT TO GO TO ED NOW   Patient Name: Courtney Collins Gender: Female DOB: 1961/08/09 Age: 62 Y 2 M 21 D Return Phone Number: [phone] (Primary) Address: City/ State/ Zip:  [place]  [address] Client [place] [facility] [place] Client Site [place] [facility] [place] Contact Type Call Who Is Calling Patient / Member / Family / Caregiver Call Type Triage / Clinical Relationship To Patient Self Return Phone Number [phone] (Primary) Chief Complaint Blood In Stool Reason for Call Symptomatic / Request for [place] states she blood in her stool. Translation No Nurse Assessment Nurse: Windle Guard, RN, Lesa Date/Time (Eastern Time): 09/26/2022 8:38:39 AM Confirm and document reason for call. If symptomatic, describe symptoms. ---Caller states she has blood in her stool Does the patient have any new or worsening symptoms? ---Yes Will a triage be completed? ---Yes Related visit to physician within the last 2 weeks? ---No Does the PT have any chronic conditions? (i.e. diabetes, asthma, this includes High risk factors for pregnancy, etc.) ---No Is this a behavioral health or substance abuse call? ---No Guidelines Guideline Title Affirmed Question Affirmed Notes Nurse Date/Time (Eastern Time) Rectal Bleeding SEVERE rectal bleeding (large blood clots; constant or on and off bleeding) Conner, RN, Lesa 09/26/2022 8:39:19 AM Disp. Time Eilene Ghazi Time) Disposition Final User 09/26/2022 8:41:02 AM Go to ED Now Yes Windle Guard, RN, Emmaline Kluver Final Disposition 09/26/2022 8:41:02 AM Go to ED Now Yes Conner, RN, Lesa PLEASE NOTE: All timestamps contained within this report are represented as [place] Standard Time. CONFIDENTIALTY NOTICE: This fax transmission is intended only for the addressee. It contains information that is legally privileged, confidential or otherwise protected from use or disclosure. If you are not the  intended recipient, you are strictly prohibited from reviewing, disclosing, copying using or disseminating any of this information or taking any action in reliance on or regarding this information. If you have received this fax in error, please notify us immediately by telephone so that we can arrange for its return to [place]. Phone: [phone], Toll-Free: [phone], Fax: [phone] Page: 2 of 2 Call Id: [id number] [place] Disagree/Comply Comply Caller Understands Yes PreDisposition Home Care Care Advice Given Per Guideline GO TO ED NOW: * You need to be seen in the Emergency Department. * Go to the ED at ___________ [place] now. Drive carefully. NOTE TO TRIAGER - DRIVING: * Another adult should drive. * Patient should not delay going to the emergency department. BRING MEDICINES: CARE ADVICE given per Rectal Bleeding (Adult) guideline. * Bring the pill bottles too. This will help the doctor (or NP/PA) to make certain you are taking the right medicines and the right dose. Referrals [facility] - ED

## 2022-09-26 NOTE — Telephone Encounter (Signed)
Pt states they have had blood in their stool twice, with the last time being this morning. Currently being triaged.

## 2022-09-26 NOTE — ED Triage Notes (Signed)
Blood in stools x 2 days. Denies taking blood thinners. Denies pain or any other discomfort.

## 2022-09-26 NOTE — ED Notes (Signed)
Pt has hx of hypertension and states her bp has been running in the [id number] systolic. PCP has increased her bp meds 2 months ago but unable to get bp lower than 170s. Alert and oriented x 4.

## 2022-09-26 NOTE — ED Provider Notes (Signed)
[facility] Provider Note  CSN: [id number] Arrival date & time: 09/26/22 0944  Chief Complaint(s) Blood In Stools  HPI Courtney Collins is a 62 y.o. female with PMH abnormal uterine bleeding, HTN, hypothyroidism, family history of stomach cancer who presents emergency department for evaluation of blood in stool.  Patient states that this morning she noticed bright red blood per rectum.  This was mixed in with brown stool and was not associated with any rectal pain or abdominal pain.  Here in the emergency department, she denies abdominal pain, nausea, vomiting, headache, fever, fatigue or any other systemic symptoms.  She states she primarily came due to concern about her family history of stomach cancer.   Past Medical History Past Medical History:  Diagnosis Date   Abnormal uterine bleeding    Arthritis    Back muscle spasm 01/12/2011   Fibroids    Hypertension    Hypothyroidism    Obesity (BMI 30-39.9)    Rash and nonspecific skin eruption 09/19/2011   Subacromial bursitis 11/25/2010   Better with steroid injection   Patient Active Problem List   Diagnosis Date Noted   S/P hysterectomy 05/23/2018   Edema 12/14/2011   Vertigo, benign paroxysmal 04/25/2011   Hypothyroidism 08/03/2007   Hyperlipidemia 10/12/2006   Morbid obesity ([place]) 10/12/2006   Essential hypertension 10/12/2006   Home Medication(s) Prior to Admission medications   Medication Sig Start Date End Date Taking? Authorizing Provider  atorvastatin (LIPITOR) 20 MG tablet Take 1 tablet (20 mg total) by mouth daily. 06/20/22   Jeanie Sewer, NP  levothyroxine (SYNTHROID) 150 MCG tablet TAKE 1 TABLET BY MOUTH ONCE DAILY **PATIENT  NEED  APPT  FOR  ANY  FURTHER  REFILLS* 06/20/22   Jeanie Sewer, NP  meloxicam (MOBIC) 7.5 MG tablet Take 2 tablets (15 mg total) by mouth daily. 04/22/22   Elba Barman, DO  metoprolol succinate (TOPROL-XL) 25 MG 24 hr tablet Take 1 tablet  (25 mg total) by mouth every evening. 08/29/22   Jeanie Sewer, NP  olmesartan-hydrochlorothiazide (BENICAR HCT) 40-25 MG tablet Take 1 tablet by mouth in the morning. 06/20/22   Jeanie Sewer, NP                                                                                                                                    Past Surgical History Past Surgical History:  Procedure Laterality Date   CYSTOSCOPY N/A 05/23/2018   Procedure: CYSTOSCOPY;  Surgeon: Christophe Louis, MD;  Location: [facility];  Service: Gynecology;  Laterality: N/A;   NO PAST SURGERIES     ROBOTIC ASSISTED TOTAL HYSTERECTOMY WITH BILATERAL SALPINGO OOPHERECTOMY Bilateral 05/23/2018   Procedure: XI ROBOTIC ASSISTED TOTAL HYSTERECTOMY WITH BILATERAL SALPINGO OOPHORECTOMY;  Surgeon: Christophe Louis, MD;  Location: [facility];  Service: Gynecology;  Laterality: Bilateral;   SHOULDER OPEN ROTATOR CUFF REPAIR Right 03/09/2016   Procedure: OPEN RIGHT ROTATOR  CUFF REPAIR WITH GRAFT AND ANCHORS;  Surgeon: Latanya Maudlin, MD;  Location: [facility];  Service: Orthopedics;  Laterality: Right;   Family History Family History  Problem Relation Age of Onset   Hypertension Mother    Cancer Maternal Aunt    Cancer Maternal Uncle    Healthy Father     Social History Social History   Tobacco Use   Smoking status: Never   Smokeless tobacco: Never  Vaping Use   Vaping Use: Never used  Substance Use Topics   Alcohol use: No   Drug use: No   Allergies Patient has no known allergies.  Review of Systems Review of Systems  Gastrointestinal:  Positive for blood in stool.    Physical Exam Vital Signs  I have reviewed the triage vital signs BP (!) 190/80   Pulse 83   Temp 98.5 F (36.9 C)   Resp 19   Ht 5' 3"$  (1.6 m)   Wt 104.3 kg   LMP 02/15/2018 (Approximate)   SpO2 97%   BMI 40.74 kg/m   Physical Exam Vitals and nursing note reviewed.  Constitutional:      General: She is not in acute distress.    Appearance: She is  well-developed.  HENT:     Head: Normocephalic and atraumatic.  Eyes:     Conjunctiva/sclera: Conjunctivae normal.  Cardiovascular:     Rate and Rhythm: Normal rate and regular rhythm.     Heart sounds: No murmur heard. Pulmonary:     Effort: Pulmonary effort is normal. No respiratory distress.     Breath sounds: Normal breath sounds.  Abdominal:     Palpations: Abdomen is soft.     Tenderness: There is no abdominal tenderness.  Genitourinary:    Rectum: Guaiac result negative.  Musculoskeletal:        General: No swelling.     Cervical back: Neck supple.  Skin:    General: Skin is warm and dry.     Capillary Refill: Capillary refill takes less than 2 seconds.  Neurological:     Mental Status: She is alert.  Psychiatric:        Mood and Affect: Mood normal.     ED Results and Treatments Labs (all labs ordered are listed, but only abnormal results are displayed) Labs Reviewed - No data to display                                                                                                                        Radiology No results found.  Pertinent labs & imaging results that were available during my care of the patient were reviewed by me and considered in my medical decision making (see MDM for details).  Medications Ordered in ED Medications - No data to display  Procedures Procedures  (including critical care time)  Medical Decision Making / ED Course   This patient presents to the ED for concern of blood in stool, this involves an extensive number of treatment options, and is a complaint that carries with it a high risk of complications and morbidity.  The differential diagnosis includes IBD flare, enteritis, infectious diarrhea, abdominal malignancy, anal fissure, internal hemorrhoid, external hemorrhoid, diverticular bleed,  hemangiodysplasia, retained foreign body  MDM: Patient seen in the emergency department for evaluation of rectal bleeding.  Physical exam largely unremarkable including a negative stool guaiac but does show a small external hemorrhoid.  Laboratory eval patient with leukocytosis to 11.2 but is otherwise unremarkable.  Hemoglobin normal at 14.4.  Fecal occult negative.  Patient presentation consistent with outlet bleeding and at this time she does not meet inpatient criteria for admission.  A referral was placed to [facility] gastroenterology who is the patient's primary gastroenterology team and she was discharged with outpatient follow-up.  She was given return precautions which she voiced understanding and was discharged.   Additional history obtained: -Additional history obtained from friend -External records from outside source obtained and reviewed including: Chart review including previous notes, labs, imaging, consultation notes   Lab Tests: -I ordered, reviewed, and interpreted labs.   The pertinent results include:   Labs Reviewed - No data to display      Medicines ordered and prescription drug management: No orders of the defined types were placed in this encounter.   -I have reviewed the patients home medicines and have made adjustments as needed  Critical interventions none    Cardiac Monitoring: The patient was maintained on a cardiac monitor.  I personally viewed and interpreted the cardiac monitored which showed an underlying rhythm of: NSR  Social Determinants of Health:  Factors impacting patients care include: none   Reevaluation: After the interventions noted above, I reevaluated the patient and found that they have :improved  Co morbidities that complicate the patient evaluation  Past Medical History:  Diagnosis Date   Abnormal uterine bleeding    Arthritis    Back muscle spasm 01/12/2011   Fibroids    Hypertension    Hypothyroidism    Obesity (BMI  30-39.9)    Rash and nonspecific skin eruption 09/19/2011   Subacromial bursitis 11/25/2010   Better with steroid injection      Dispostion: I considered admission for this patient, but at this time she does not meet inpatient criteria for admission she is safe for discharge with outpatient follow-up     Final Clinical Impression(s) / ED Diagnoses Final diagnoses:  None     @PCDICTATION$ @    Teressa Lower, MD 09/26/22 1600

## 2022-09-29 ENCOUNTER — Encounter: Payer: Self-pay | Admitting: Family

## 2022-09-29 ENCOUNTER — Ambulatory Visit (INDEPENDENT_AMBULATORY_CARE_PROVIDER_SITE_OTHER): Payer: BLUE CROSS/BLUE SHIELD | Admitting: Family

## 2022-09-29 VITALS — BP 139/89 | HR 74 | Temp 97.8°F | Ht 63.0 in | Wt 232.8 lb

## 2022-09-29 DIAGNOSIS — M25512 Pain in left shoulder: Secondary | ICD-10-CM | POA: Diagnosis not present

## 2022-09-29 DIAGNOSIS — I10 Essential (primary) hypertension: Secondary | ICD-10-CM

## 2022-09-29 DIAGNOSIS — E782 Mixed hyperlipidemia: Secondary | ICD-10-CM

## 2022-09-29 DIAGNOSIS — Z1231 Encounter for screening mammogram for malignant neoplasm of breast: Secondary | ICD-10-CM

## 2022-09-29 DIAGNOSIS — E039 Hypothyroidism, unspecified: Secondary | ICD-10-CM | POA: Diagnosis not present

## 2022-09-29 DIAGNOSIS — G8929 Other chronic pain: Secondary | ICD-10-CM

## 2022-09-29 NOTE — Progress Notes (Signed)
Patient ID: Courtney Collins, female    DOB: 1961-06-21, 62 y.o.   MRN: [id number]  Chief Complaint  Patient presents with   Hypertension   Hyperlipidemia   Hypothyroidism   HPI: Hypertension: Patient is currently maintained on the following medications for blood pressure: Olmesartan-HCTZ, Metoprolol Failed meds include: Losartan - ineffective; Amlodipine - swelling, rash. Patient reports good compliance with blood pressure medications. Patient denies chest pain, headaches, shortness of breath or swelling. Last 3 blood pressure readings in our office are as follows: BP Readings from Last 3 Encounters:  09/29/22 139/89  09/26/22 (!) 178/88  04/22/22 (!) 140/85   Hypothyroidism: Patient presents today for followup of Hypothyroidism.  Patient reports positive compliance with daily medication.  Patient denies any of the following symptoms: fatigue, cold intolerance, constipation, weight gain or inability to lose weight, muscle weakness, mental slowing, dry hair and skin. Last TSH and free T4: Lab Results  Component Value Date   FREE T4 1.35 02/03/2022   FREE T4 0.22 (L) 10/06/2008   TSH 0.02 Repeated and verified X2. (L) 02/03/2022   TSH 1.270 04/26/2021   TSH 0.630 09/03/2020    Hyperlipidemia: Patient is currently maintained on the following medication for hyperlipidemia: Lipitor. Patient denies myalgia or other side effects. Patient reports good compliance with low fat/low cholesterol diet.  Last lipid panel as follows: Lab Results  Component Value Date   CHOL 156 02/03/2022   HDL 45.40 02/03/2022   LDLCALC 90 02/03/2022   LDLDIRECT 180 (H) 01/21/2010   TRIG 102.0 02/03/2022   CHOLHDL 3 02/03/2022   Assessment & Plan:   Problem List Items Addressed This Visit       Cardiovascular and Mediastinum   Essential hypertension    chronic taking Metoprolol 44m ER, Olmesartan-HCTZ 40-287msending refills BP good today f/u 6 mos w/fasting labs      Relevant  Medications   atorvastatin (LIPITOR) 20 MG tablet   metoprolol succinate (TOPROL-XL) 25 MG 24 hr tablet   olmesartan-hydrochlorothiazide (BENICAR HCT) 40-25 MG tablet     Endocrine   Hypothyroidism    chronic taking Levo 15047mqd last T4 wnl, 01/2022 sending refill f/u 6 mos      Relevant Medications   levothyroxine (SYNTHROID) 150 MCG tablet   metoprolol succinate (TOPROL-XL) 25 MG 24 hr tablet     Other   Hyperlipidemia    chronic taking Lipitor 55m12m sending refill f/u 6 mos with fasting labs      Relevant Medications   atorvastatin (LIPITOR) 20 MG tablet   metoprolol succinate (TOPROL-XL) 25 MG 24 hr tablet   olmesartan-hydrochlorothiazide (BENICAR HCT) 40-25 MG tablet   Other Visit Diagnoses     Breast cancer screening by mammogram    -  Primary   Relevant Orders   MM DIGITAL SCREENING BILATERAL   Chronic left shoulder pain       Relevant Medications   meloxicam (MOBIC) 7.5 MG tablet      Subjective:    Outpatient Medications Prior to Visit  Medication Sig Dispense Refill   atorvastatin (LIPITOR) 20 MG tablet Take 1 tablet (20 mg total) by mouth daily. 90 tablet 0   levothyroxine (SYNTHROID) 150 MCG tablet TAKE 1 TABLET BY MOUTH ONCE DAILY **PATIENT  NEED  APPT  FOR  ANY  FURTHER  REFILLS* 90 tablet 0   meloxicam (MOBIC) 7.5 MG tablet Take 2 tablets (15 mg total) by mouth daily. 60 tablet 0   metoprolol succinate (TOPROL-XL) 25 MG 24  hr tablet Take 1 tablet (25 mg total) by mouth every evening. 180 tablet 0   olmesartan-hydrochlorothiazide (BENICAR HCT) 40-25 MG tablet Take 1 tablet by mouth in the morning. 90 tablet 1   No facility-administered medications prior to visit.   Past Medical History:  Diagnosis Date   Abnormal uterine bleeding    Arthritis    Back muscle spasm 01/12/2011   Fibroids    Hypertension    Hypothyroidism    Obesity (BMI 30-39.9)    Rash and nonspecific skin eruption 09/19/2011   Subacromial bursitis 11/25/2010   Better with  steroid injection   Past Surgical History:  Procedure Laterality Date   CYSTOSCOPY N/A 05/23/2018   Procedure: CYSTOSCOPY;  Surgeon: Christophe Louis, MD;  Location: [facility];  Service: Gynecology;  Laterality: N/A;   NO PAST SURGERIES     ROBOTIC ASSISTED TOTAL HYSTERECTOMY WITH BILATERAL SALPINGO OOPHERECTOMY Bilateral 05/23/2018   Procedure: XI ROBOTIC ASSISTED TOTAL HYSTERECTOMY WITH BILATERAL SALPINGO OOPHORECTOMY;  Surgeon: Christophe Louis, MD;  Location: [facility];  Service: Gynecology;  Laterality: Bilateral;   SHOULDER OPEN ROTATOR CUFF REPAIR Right 03/09/2016   Procedure: OPEN RIGHT ROTATOR CUFF REPAIR WITH GRAFT AND ANCHORS;  Surgeon: Latanya Maudlin, MD;  Location: [facility];  Service: Orthopedics;  Laterality: Right;   No Known Allergies    Objective:    Physical Exam Vitals and nursing note reviewed.  Constitutional:      Appearance: Normal appearance.  Cardiovascular:     Rate and Rhythm: Normal rate and regular rhythm.  Pulmonary:     Effort: Pulmonary effort is normal.     Breath sounds: Normal breath sounds.  Musculoskeletal:        General: Normal range of motion.  Skin:    General: Skin is warm and dry.  Neurological:     Mental Status: She is alert.  Psychiatric:        Mood and Affect: Mood normal.        Behavior: Behavior normal.    BP 139/89 (BP Location: Left Arm, Patient Position: Sitting, Cuff Size: Large)   Pulse 74   Temp 97.8 F (36.6 C) (Temporal)   Ht 5' 3"$  (1.6 m)   Wt 232 lb 12.8 oz (105.6 kg)   LMP 02/15/2018 (Approximate)   SpO2 98%   BMI 41.24 kg/m  Wt Readings from Last 3 Encounters:  09/29/22 232 lb 12.8 oz (105.6 kg)  09/26/22 230 lb (104.3 kg)  04/22/22 231 lb 6.4 oz (105 kg)     Jeanie Sewer, NP

## 2022-10-01 MED ORDER — MELOXICAM 7.5 MG PO TABS: 7.5000 mg | ORAL_TABLET | Freq: Every day | ORAL | 2 refills | Status: DC | PRN

## 2022-10-01 MED ORDER — OLMESARTAN MEDOXOMIL-HCTZ 40-25 MG PO TABS: 1.0000 | ORAL_TABLET | Freq: Every morning | ORAL | 1 refills | Status: DC

## 2022-10-01 MED ORDER — ATORVASTATIN CALCIUM 20 MG PO TABS: 20.0000 mg | ORAL_TABLET | Freq: Every day | ORAL | 1 refills | Status: DC

## 2022-10-01 MED ORDER — LEVOTHYROXINE SODIUM 150 MCG PO TABS: ORAL_TABLET | ORAL | 1 refills | Status: DC

## 2022-10-01 MED ORDER — METOPROLOL SUCCINATE ER 25 MG PO TB24: 50.0000 mg | ORAL_TABLET | Freq: Every evening | ORAL | 0 refills | Status: DC

## 2022-10-01 NOTE — Assessment & Plan Note (Signed)
chronic taking Levo 182mg qd last T4 wnl, 01/2022 sending refill f/u 6 mos

## 2022-10-01 NOTE — Assessment & Plan Note (Signed)
chronic taking Metoprolol 53m ER, Olmesartan-HCTZ 40-221msending refills BP good today f/u 6 mos w/fasting labs

## 2022-10-01 NOTE — Assessment & Plan Note (Signed)
chronic taking Lipitor 76m qd sending refill f/u 6 mos with fasting labs

## 2022-11-24 ENCOUNTER — Other Ambulatory Visit: Payer: Self-pay | Admitting: Family

## 2022-11-24 DIAGNOSIS — G8929 Other chronic pain: Secondary | ICD-10-CM

## 2022-11-29 ENCOUNTER — Ambulatory Visit: Payer: BLUE CROSS/BLUE SHIELD

## 2022-12-14 ENCOUNTER — Other Ambulatory Visit: Payer: Self-pay | Admitting: Family

## 2022-12-14 DIAGNOSIS — I10 Essential (primary) hypertension: Secondary | ICD-10-CM

## 2022-12-18 ENCOUNTER — Other Ambulatory Visit: Payer: Self-pay | Admitting: Family

## 2022-12-18 DIAGNOSIS — I10 Essential (primary) hypertension: Secondary | ICD-10-CM

## 2022-12-19 ENCOUNTER — Telehealth: Payer: Self-pay | Admitting: Family

## 2022-12-19 ENCOUNTER — Other Ambulatory Visit: Payer: Self-pay

## 2022-12-19 ENCOUNTER — Emergency Department (HOSPITAL_COMMUNITY): Payer: BLUE CROSS/BLUE SHIELD

## 2022-12-19 ENCOUNTER — Encounter (HOSPITAL_COMMUNITY): Payer: Self-pay

## 2022-12-19 ENCOUNTER — Emergency Department (HOSPITAL_COMMUNITY)
Admission: EM | Admit: 2022-12-19 | Discharge: 2022-12-19 | Disposition: A | Discharge status: Home / Self Care | Payer: BLUE CROSS/BLUE SHIELD | Attending: Emergency Medicine | Admitting: Emergency Medicine

## 2022-12-19 DIAGNOSIS — Z79899 Other long term (current) drug therapy: Secondary | ICD-10-CM | POA: Diagnosis not present

## 2022-12-19 DIAGNOSIS — D72829 Elevated white blood cell count, unspecified: Secondary | ICD-10-CM | POA: Diagnosis not present

## 2022-12-19 DIAGNOSIS — R7309 Other abnormal glucose: Secondary | ICD-10-CM | POA: Diagnosis not present

## 2022-12-19 DIAGNOSIS — I10 Essential (primary) hypertension: Secondary | ICD-10-CM | POA: Diagnosis not present

## 2022-12-19 DIAGNOSIS — R519 Headache, unspecified: Secondary | ICD-10-CM | POA: Diagnosis not present

## 2022-12-19 DIAGNOSIS — R0602 Shortness of breath: Secondary | ICD-10-CM | POA: Diagnosis present

## 2022-12-19 LAB — CBC WITH DIFFERENTIAL/PLATELET
Abs Immature Granulocytes: 0.09 10*3/uL — ABNORMAL HIGH (ref 0.00–0.07)
Basophils Absolute: 0.1 10*3/uL (ref 0.0–0.1)
Basophils Relative: 1 %
Eosinophils Absolute: 0.2 10*3/uL (ref 0.0–0.5)
Eosinophils Relative: 2 %
HCT: 47.8 % — ABNORMAL HIGH (ref 36.0–46.0)
Hemoglobin: 15.7 g/dL — ABNORMAL HIGH (ref 12.0–15.0)
Immature Granulocytes: 1 %
Lymphocytes Relative: 11 %
Lymphs Abs: 1.4 10*3/uL (ref 0.7–4.0)
MCH: 29 pg (ref 26.0–34.0)
MCHC: 32.8 g/dL (ref 30.0–36.0)
MCV: 88.2 fL (ref 80.0–100.0)
Monocytes Absolute: 0.8 10*3/uL (ref 0.1–1.0)
Monocytes Relative: 6 %
Neutro Abs: 10.4 10*3/uL — ABNORMAL HIGH (ref 1.7–7.7)
Neutrophils Relative %: 79 %
Platelets: 408 10*3/uL — ABNORMAL HIGH (ref 150–400)
RBC: 5.42 MIL/uL — ABNORMAL HIGH (ref 3.87–5.11)
RDW: 13 % (ref 11.5–15.5)
WBC: 12.9 10*3/uL — ABNORMAL HIGH (ref 4.0–10.5)
nRBC: 0 % (ref 0.0–0.2)

## 2022-12-19 LAB — BASIC METABOLIC PANEL
Anion gap: 13 (ref 5–15)
BUN: 12 mg/dL (ref 8–23)
CO2: 24 mmol/L (ref 22–32)
Calcium: 10 mg/dL (ref 8.9–10.3)
Chloride: 99 mmol/L (ref 98–111)
Creatinine, Ser: 0.87 mg/dL (ref 0.44–1.00)
GFR, Estimated: >60 mL/min (ref >60)
Glucose, Bld: 125 mg/dL — ABNORMAL HIGH (ref 70–99)
Potassium: 3.9 mmol/L (ref 3.5–5.1)
Sodium: 136 mmol/L (ref 135–145)

## 2022-12-19 LAB — BRAIN NATRIURETIC PEPTIDE: B Natriuretic Peptide: 13.8 pg/mL (ref 0.0–100.0)

## 2022-12-19 MED ORDER — KETOROLAC TROMETHAMINE 15 MG/ML IJ SOLN
15.0000 mg | Freq: Once | INTRAMUSCULAR | Status: AC
  Administered 2022-12-19: 15 mg via INTRAVENOUS
  Filled 2022-12-19: qty 1

## 2022-12-19 MED ORDER — SODIUM CHLORIDE 0.9 % IV BOLUS
1000.0000 mL | Freq: Once | INTRAVENOUS | Status: AC
  Administered 2022-12-19: 1000 mL via INTRAVENOUS

## 2022-12-19 MED ORDER — HYDRALAZINE HCL 20 MG/ML IJ SOLN
10.0000 mg | Freq: Once | INTRAMUSCULAR | Status: AC
  Administered 2022-12-19: 10 mg via INTRAVENOUS
  Filled 2022-12-19: qty 1

## 2022-12-19 NOTE — ED Provider Notes (Signed)
[place] EMERGENCY DEPARTMENT AT [facility] Provider Note   CSN: [id number] Arrival date & time: 12/19/22  0848     History  Chief Complaint  Patient presents with   Hypertension    Courtney Collins is a 62 y.o. female with a PMHx of HTN, who presents to the ED with concerns for elevated blood pressure x 2 days.  At home her blood pressure was 204 systolic.  Has had some shortness of breath.  Also notes a frontal headache, denies this headache feeling as if is the worst headache of her life.  Patient takes metoprolol and Benicar for her blood pressure has been compliant with her medications.  Has not take any medications for her headache.  Denies chest pain, shortness of breath, blurred vision, urinary symptoms, numbness, tingling, abdominal pain, nausea, vomiting.   The history is provided by the patient. No language interpreter was used.       Home Medications Prior to Admission medications   Medication Sig Start Date End Date Taking? Authorizing Provider  atorvastatin (LIPITOR) 20 MG tablet Take 1 tablet (20 mg total) by mouth daily. 10/01/22   Dulce Sellar, NP  levothyroxine (SYNTHROID) 150 MCG tablet TAKE 1 TABLET BY MOUTH ONCE DAILY 10/01/22   Dulce Sellar, NP  meloxicam (MOBIC) 7.5 MG tablet Take 1 to 2 tablets by mouth daily as needed for pain. 11/25/22   Dulce Sellar, NP  metoprolol succinate (TOPROL-XL) 25 MG 24 hr tablet Take 2 tablets by mouth every evening. 12/14/22   Dulce Sellar, NP  olmesartan-hydrochlorothiazide (BENICAR HCT) 40-25 MG tablet Take 1 tablet by mouth in the morning. 12/19/22   Dulce Sellar, NP      Allergies    Patient has no known allergies.    Review of Systems   Review of Systems  All other systems reviewed and are negative.   Physical Exam Updated Vital Signs BP (!) 227/176   Pulse 73   Temp 97.6 F (36.4 C)   Resp 16   Ht 5\' 3"  (1.6 m)   Wt 103 kg   LMP 02/15/2018 (Approximate)   SpO2 98%   BMI  40.21 kg/m  Physical Exam Vitals and nursing note reviewed.  Constitutional:      General: She is not in acute distress.    Appearance: She is not diaphoretic.  HENT:     Head: Normocephalic and atraumatic.     Mouth/Throat:     Pharynx: No oropharyngeal exudate.  Eyes:     General: No scleral icterus.    Conjunctiva/sclera: Conjunctivae normal.  Cardiovascular:     Rate and Rhythm: Normal rate and regular rhythm.     Pulses: Normal pulses.     Heart sounds: Normal heart sounds.  Pulmonary:     Effort: Pulmonary effort is normal. No respiratory distress.     Breath sounds: Normal breath sounds. No wheezing.  Abdominal:     General: Bowel sounds are normal.     Palpations: Abdomen is soft. There is no mass.     Tenderness: There is no abdominal tenderness. There is no guarding or rebound.  Musculoskeletal:        General: Normal range of motion.     Cervical back: Normal range of motion and neck supple.  Skin:    General: Skin is warm and dry.  Neurological:     Mental Status: She is alert.     Comments: No focal neurological deficits. Negative pronator drift. Able to ambulate without  assistance or difficulty. Strength and sensation intact to BUE and BLE. Grip strength 5/5 bilaterally.    Psychiatric:        Behavior: Behavior normal.     ED Results / Procedures / Treatments   Labs (all labs ordered are listed, but only abnormal results are displayed) Labs Reviewed  BASIC METABOLIC PANEL - Abnormal; Notable for the following components:      Result Value   Glucose, Bld 125 (*)    All other components within normal limits  CBC WITH DIFFERENTIAL/PLATELET - Abnormal; Notable for the following components:   WBC 12.9 (*)    RBC 5.42 (*)    Hemoglobin 15.7 (*)    HCT 47.8 (*)    Platelets 408 (*)    Neutro Abs 10.4 (*)    Abs Immature Granulocytes 0.09 (*)    All other components within normal limits  BRAIN NATRIURETIC PEPTIDE    EKG EKG  Interpretation  Date/Time:  Monday Dec 19 2022 10:29:02 EDT Ventricular Rate:  71 PR Interval:  150 QRS Duration: 78 QT Interval:  432 QTC Calculation: 469 R Axis:   22 Text Interpretation: Normal sinus rhythm Normal ECG When compared with ECG of 22-Apr-2021 18:49, PREVIOUS ECG IS PRESENT Confirmed by Virgina Norfolk ([id number]) on 12/19/2022 2:34:46 PM  Radiology CT Head Wo Contrast  Result Date: 12/19/2022 CLINICAL DATA:  62 year old female with headache.  Hypertensive. EXAM: CT HEAD WITHOUT CONTRAST TECHNIQUE: Contiguous axial images were obtained from the base of the skull through the vertex without intravenous contrast. RADIATION DOSE REDUCTION: This exam was performed according to the departmental dose-optimization program which includes automated exposure control, adjustment of the mA and/or kV according to patient size and/or use of iterative reconstruction technique. COMPARISON:  Brain MRI, Head CT 04/23/2021. FINDINGS: Brain: Cerebral volume is stable and within normal limits for age. No midline shift, ventriculomegaly, mass effect, evidence of mass lesion, intracranial hemorrhage or evidence of cortically based acute infarction. Scattered cerebral white matter hypodensity, appear stable from the MRI last year. Incidental left lentiform perivascular space (normal variant). No cortical encephalomalacia identified. Vascular: Mild Calcified atherosclerosis at the skull base. No suspicious intracranial vascular hyperdensity. Skull: No acute osseous abnormality identified. Sinuses/Orbits: Visualized paranasal sinuses and mastoids are stable and well aerated. Other: Stable orbit and scalp soft tissues. IMPRESSION: No acute intracranial abnormality. Stable since 2022 nonspecific mild to moderate for age chronic white matter changes. Electronically Signed   By: Odessa Fleming M.D.   On: 12/19/2022 11:08   DG Chest 2 View  Result Date: 12/19/2022 CLINICAL DATA:  Provided history: Shortness of breath. EXAM: CHEST -  2 VIEW COMPARISON:  Prior chest radiographs 04/26/2021 and earlier FINDINGS: Heart size within normal limits. Aortic atherosclerosis. No appreciable airspace consolidation or pulmonary edema. No evidence of pleural effusion or pneumothorax. No acute osseous abnormality identified. Degenerative changes of the spine. IMPRESSION: 1.  No evidence of acute cardiopulmonary abnormality. 2.  Aortic Atherosclerosis (ICD10-I70.0). Electronically Signed   By: Jackey Loge D.O.   On: 12/19/2022 10:55    Procedures Procedures    Medications Ordered in ED Medications  hydrALAZINE (APRESOLINE) injection 10 mg (10 mg Intravenous Given 12/19/22 1619)  sodium chloride 0.9 % bolus 1,000 mL (0 mLs Intravenous Stopped 12/19/22 1756)  ketorolac (TORADOL) 15 MG/ML injection 15 mg (15 mg Intravenous Given 12/19/22 1621)    ED Course/ Medical Decision Making/ A&P Clinical Course as of 12/19/22 1800  Mon Dec 19, 2022  1439 Discussed with patient lab and  imaging studies. Pt notes slight improvement of her headache. Discussed with patient treatment plan. Pt agreeable at this time.  [SB]  1737 Reevaluated and noted improvement of headache.  Discussed with patie discharge treatment plan.  Answered all the questions.  Patient appears safe to discharge at this time. [SB]    Clinical Course User Index [SB] Belmira Daley A, PA-C                             Medical Decision Making Amount and/or Complexity of Data Reviewed Labs: ordered. Radiology: ordered.  Risk Prescription drug management.   Pt presented to the ED with concerns for elevated blood pressure x 2 days.  Blood pressure at home was 204 systolic.  Patient also with frontal headache.  She denies this headache felt like the worst headache of her life.  Blood pressure in triage at 227/176.  On exam patient without focal neurological deficits.  Negative pronator drift.  Strength sensation intact to bilateral upper and lower extremities.  Differential diagnosis  includes SAH, ICH, hypertensive urgency, hypertensive emergency, migraine, tension headache.   Labs:  I ordered, and personally interpreted labs.  The pertinent results include:   CBC with slightly elevated leukocytosis at 12.9 otherwise unremarkable BMP with slightly elevated glucose at 125 otherwise unremarkable BNP unremarkable  Imaging: I ordered imaging studies including CT head, chest x-ray I independently visualized and interpreted imaging which showed: No acute findings noted on CT head or chest x-ray I agree with the radiologist interpretation  Medications:  I ordered medication including Toradol, IV fluids, hydralazine for migraine cocktail and blood pressure management Reevaluation of the patient after these medicines and interventions, I reevaluated the patient and found that they have improved I have reviewed the patients home medicines and have made adjustments as needed   Disposition: Patient presented suspicious for hypertension as well as bad headache. EKG without acute ST/T changes.CXR without cardiomegaly or acute cardiac or respiratory findings. Patient headache improved with migraine cocktail and hydralazine. Presentation less likely due to [facility] or ICH.  Repeat vital signs with blood pressure improved to 172/95. Patient resting comfortably on stretcher. After consideration of the diagnostic results and the patients response to treatment, I feel that the patient would benefit from Discharge home.  Stressed importance of taking hypertension medications as prescribed and getting a follow up appointment with a primary care provider this week. Discussed supportive care measures and strict return precautions with patient consisting of increasing persistent chest pain, increasing persistent shortness of breath, sudden onset headache.  Pt acknowledges and verbalizes understanding and is agreeable to plan. Patient appears safe for discharge at this time.  Follow-up as indicated in the  discharge paperwork.  This chart was dictated using voice recognition software, Dragon. Despite the best efforts of this provider to proofread and correct errors, errors may still occur which can change documentation meaning.   Final Clinical Impression(s) / ED Diagnoses Final diagnoses:  Hypertension, unspecified type  Bad headache    Rx / DC Orders ED Discharge Orders     None         Kayln Garceau A, PA-C 12/19/22 1802    Virgina Norfolk, DO 12/21/22 1250

## 2022-12-19 NOTE — Discharge Instructions (Addendum)
It was a pleasure take care of you today!  There were no concerning findings on your lab or imaging studies.  Continue to take your antihypertensives as prescribed.  Follow-up with your primary care provider regarding your elevated blood pressure.  Ensure to keep a log of your blood pressure so that way your primary care provider can review it.  Return to the emergency department if you experience increasing/worsening symptoms.

## 2022-12-19 NOTE — ED Notes (Signed)
Dc instructions reviewed with pt. PT verbalized understanding. PT DC 

## 2022-12-19 NOTE — ED Triage Notes (Signed)
Pt to ED with complaint of hypertension x2 days. Called PCP and instructed to come to ED for further evaluation. +HA. Denies blurred vision, CP, or SHOB.   Takes metoprolol and benicar for HTN - taking as prescribed.

## 2022-12-19 NOTE — ED Provider Triage Note (Signed)
Emergency Medicine Provider Triage Evaluation Note  Courtney Collins , a 62 y.o. female  was evaluated in triage.  Pt complains of elevated blood pressure x 2 days.  At home her blood pressure was 204 systolic.  Has had some shortness of breath.  Also notes a frontal headache, denies this headache feeling as if is the worst headache of her life.  Patient takes metoprolol and Benicar for her blood pressure has been compliant with her medications.  Has not take any medications for her headache.  Denies chest pain, shortness of breath, blurred vision, urinary symptoms, numbness, tingling, abdominal pain, nausea, vomiting.  Review of Systems  Positive:  Negative:   Physical Exam  BP (!) 227/176   Pulse 73   Temp 97.6 F (36.4 C)   Resp 16   Ht 5\' 3"  (1.6 m)   Wt 103 kg   LMP 02/15/2018 (Approximate)   SpO2 98%   BMI 40.21 kg/m  Gen:   Awake, no distress   Resp:  Normal effort  MSK:   Moves extremities without difficulty  Other:  Negative pronator drift.  Strength sensation intact to bilateral upper and lower extremities.  No facial droop appreciated.  Medical Decision Making  Medically screening exam initiated at 10:00 AM.  Appropriate orders placed.  KLARITY HOELZLE was informed that the remainder of the evaluation will be completed by another provider, this initial triage assessment does not replace that evaluation, and the importance of remaining in the ED until their evaluation is complete.  Workup initiated   Kana Reimann A, PA-C 12/19/22 1005

## 2022-12-19 NOTE — Telephone Encounter (Signed)
Patient seen at ED   Patient Name First: Courtney Last: Collins Gender: Female DOB: October 26, 1960 Age: 62 Y 5 M 14 D Return Phone Number: [phone] (Primary) Address: City/ State/ Zip: [place]  [address] Client [facility] - Human resources [facility] [employer] Provider Tana Conch- MD Contact Type Call Who Is Calling Patient / Member / Family / Caregiver Call Type Triage / Clinical Relationship To Patient Self Return Phone Number [phone] (Primary) Chief Complaint BLOOD PRESSURE HIGH - Systolic (top number) 200 or greater Reason for Call Symptomatic / Request for Health Information Initial Comment Caller states her blood pressure was over 200 this weekend, this morning its 205/120 Translation No Nurse Assessment Nurse: Andrey Campanile, RN, Marylene Land Date/Time (Eastern Time): 12/19/2022 7:59:32 AM Confirm and document reason for call. If symptomatic, describe symptoms. ---Caller states her blood pressure was over 200 this weekend, this morning its 205/120. Has had a headache since Saturday. Takes Metoprolol and Olmesartan for HTN. Does the patient have any new or worsening symptoms? ---Yes Will a triage be completed? ---Yes Related visit to physician within the last 2 weeks? ---No Does the PT have any chronic conditions? (i.e. diabetes, asthma, this includes High risk factors for pregnancy, etc.) ---Yes List chronic conditions. ---HTN, High Cholesterol, Hypothyroidism Is this a behavioral health or substance abuse call? ---No Guidelines Guideline Title Affirmed Question Affirmed Notes Nurse Date/Time (Eastern Time) Blood Pressure - High [1] Systolic BP >= 160 OR Diastolic >= 100 AND [2] cardiac (e.g., breathing difficulty, chest pain) or neurologic symptoms (e.g., Andrey Campanile, RN, Marylene Land 12/19/2022 8:03:24 AM  Guidelines Guideline Title Affirmed Question Affirmed Notes Nurse Date/Time  (Eastern Time) new-onset blurred or double vision, unsteady gait) Disp. Time Lamount Cohen Time) Disposition Final User 12/19/2022 7:58:39 AM Send to Urgent Michela Pitcher, Lanette 12/19/2022 8:05:13 AM Go to ED Now Yes Andrey Campanile, RN, Marylene Land Final Disposition 12/19/2022 8:05:13 AM Go to ED Now Yes Andrey Campanile, RN, Rosalyn Charters Disagree/Comply Comply Caller Understands Yes PreDisposition Call Doctor Care Advice Given Per Guideline GO TO ED NOW: * You need to be seen in the Emergency Department. NOTE TO TRIAGER - DRIVING: * Another adult should drive. CALL EMS 911 IF: * Passes out or faints * Becomes confused * Becomes too weak to stand * You become worse CARE ADVICE given per High Blood Pressure (Adult) guideline.   Referrals [facility] - ED

## 2022-12-21 ENCOUNTER — Ambulatory Visit (INDEPENDENT_AMBULATORY_CARE_PROVIDER_SITE_OTHER): Payer: BLUE CROSS/BLUE SHIELD | Admitting: Family

## 2022-12-21 VITALS — BP 147/92 | HR 104 | Temp 97.8°F | Ht 63.0 in | Wt 225.0 lb

## 2022-12-21 DIAGNOSIS — I10 Essential (primary) hypertension: Secondary | ICD-10-CM

## 2022-12-21 MED ORDER — METOPROLOL SUCCINATE ER 50 MG PO TB24: 50.0000 mg | ORAL_TABLET | Freq: Every evening | ORAL | 1 refills | Status: DC

## 2022-12-21 MED ORDER — AMLODIPINE BESYLATE 5 MG PO TABS: 5.0000 mg | ORAL_TABLET | Freq: Two times a day (BID) | ORAL | 2 refills | Status: DC

## 2022-12-21 NOTE — Assessment & Plan Note (Addendum)
chronic, Unstable taking Metoprolol 50mg  ER, Olmesartan-HCTZ 40-25mg  pt had high BP readings at home, bad headache, dizziness, went to ER, BP up to 227/176 - given hydralyzine  and it came down BP slightly high today pt reports losing 10lbs and has been exercising, advised to continue! starting Amlodipine, she states she had swelling & rash with 10mg  dose but also was not on HCTZ, will start with 5mg  qd, then increase to bid after 1 week if no rash advised to call office if readings are not coming down f/u 1 mos

## 2022-12-21 NOTE — Progress Notes (Signed)
Patient ID: Courtney Collins, female    DOB: May 24, 1961, 62 y.o.   MRN: [id number]  Chief Complaint  Patient presents with   Hypertension    Pt was seen in ED on 5/6 for a headache, BP was 227/176. Pt states she did take her BP medications today.     HPI: Hypertension: Patient is currently maintained on the following medications for blood pressure: Olmesartan-HCTZ, Metoprolol 50mg  qhs. Had to go to ER due to really high BP - 227/176 - given meds and came down some, had bad headache and a little dizziness.  Failed meds include: Losartan - ineffective; Amlodipine - swelling, rash. Patient reports good compliance with blood pressure medications. Patient denies chest pain, shortness of breath or swelling. Last 3 blood pressure readings in our office are as follows: BP Readings from Last 3 Encounters:  12/21/22 (!) 147/92  12/19/22 (!) 172/95  09/29/22 139/89    Assessment & Plan:  Essential hypertension Assessment & Plan: chronic, Unstable taking Metoprolol 50mg  ER, Olmesartan-HCTZ 40-25mg  pt had high BP readings at home, bad headache, dizziness, went to ER, BP up to 227/176 - given hydralyzine  and it came down BP slightly high today pt reports losing 10lbs and has been exercising, advised to continue! starting Amlodipine, she states she had swelling & rash with 10mg  dose but also was not on HCTZ, will start with 5mg  qd, then increase to bid after 1 week if no rash advised to call office if readings are not coming down f/u 1 mos   Orders: -     amLODIPine Besylate; Take 1 tablet (5 mg total) by mouth in the morning and at bedtime. START with 1 pill in evening or qhs, for 1 week, if no rash, add 1 pill in am.  Dispense: 60 tablet; Refill: 2 -     Metoprolol Succinate ER; Take 1 tablet (50 mg total) by mouth every evening.  Dispense: 90 tablet; Refill: 1   Subjective:    Outpatient Medications Prior to Visit  Medication Sig Dispense Refill   atorvastatin (LIPITOR) 20 MG tablet  Take 1 tablet (20 mg total) by mouth daily. 90 tablet 1   levothyroxine (SYNTHROID) 150 MCG tablet TAKE 1 TABLET BY MOUTH ONCE DAILY 90 tablet 1   meloxicam (MOBIC) 7.5 MG tablet Take 1 to 2 tablets by mouth daily as needed for pain. 60 tablet 1   olmesartan-hydrochlorothiazide (BENICAR HCT) 40-25 MG tablet Take 1 tablet by mouth in the morning. 90 tablet 0   metoprolol succinate (TOPROL-XL) 25 MG 24 hr tablet Take 2 tablets by mouth every evening. 180 tablet 0   No facility-administered medications prior to visit.   Past Medical History:  Diagnosis Date   Abnormal uterine bleeding    Arthritis    Back muscle spasm 01/12/2011   Fibroids    Hypertension    Hypothyroidism    Obesity (BMI 30-39.9)    Rash and nonspecific skin eruption 09/19/2011   Subacromial bursitis 11/25/2010   Better with steroid injection   Past Surgical History:  Procedure Laterality Date   CYSTOSCOPY N/A 05/23/2018   Procedure: CYSTOSCOPY;  Surgeon: Gerald Leitz, MD;  Location: [facility];  Service: Gynecology;  Laterality: N/A;   NO PAST SURGERIES     ROBOTIC ASSISTED TOTAL HYSTERECTOMY WITH BILATERAL SALPINGO OOPHERECTOMY Bilateral 05/23/2018   Procedure: XI ROBOTIC ASSISTED TOTAL HYSTERECTOMY WITH BILATERAL SALPINGO OOPHORECTOMY;  Surgeon: Gerald Leitz, MD;  Location: [facility];  Service: Gynecology;  Laterality: Bilateral;   SHOULDER  OPEN ROTATOR CUFF REPAIR Right 03/09/2016   Procedure: OPEN RIGHT ROTATOR CUFF REPAIR WITH GRAFT AND ANCHORS;  Surgeon: Ranee Gosselin, MD;  Location: [facility];  Service: Orthopedics;  Laterality: Right;   No Known Allergies    Objective:    Physical Exam Vitals and nursing note reviewed.  Constitutional:      Appearance: Normal appearance. She is obese.  Cardiovascular:     Rate and Rhythm: Normal rate and regular rhythm.  Pulmonary:     Effort: Pulmonary effort is normal.     Breath sounds: Normal breath sounds.  Musculoskeletal:        General: Normal range of motion.  Skin:     General: Skin is warm and dry.  Neurological:     Mental Status: She is alert.  Psychiatric:        Mood and Affect: Mood normal.        Behavior: Behavior normal.    BP (!) 147/92 (BP Location: Left Arm, Patient Position: Sitting, Cuff Size: Large)   Pulse (!) 104   Temp 97.8 F (36.6 C) (Temporal)   Ht 5\' 3"  (1.6 m)   Wt 225 lb (102.1 kg)   LMP 02/15/2018 (Approximate)   SpO2 98%   BMI 39.86 kg/m  Wt Readings from Last 3 Encounters:  12/21/22 225 lb (102.1 kg)  12/19/22 227 lb (103 kg)  09/29/22 232 lb 12.8 oz (105.6 kg)     Dulce Sellar, NP

## 2022-12-22 ENCOUNTER — Encounter: Payer: Self-pay | Admitting: Family

## 2023-01-05 ENCOUNTER — Encounter: Payer: Self-pay | Admitting: Physician Assistant

## 2023-01-05 ENCOUNTER — Ambulatory Visit (INDEPENDENT_AMBULATORY_CARE_PROVIDER_SITE_OTHER): Payer: BLUE CROSS/BLUE SHIELD | Admitting: Physician Assistant

## 2023-01-05 VITALS — BP 166/98 | HR 91 | Temp 97.3°F | Ht 63.0 in | Wt 225.0 lb

## 2023-01-05 DIAGNOSIS — K130 Diseases of lips: Secondary | ICD-10-CM | POA: Diagnosis not present

## 2023-01-05 MED ORDER — TRIAMCINOLONE ACETONIDE 0.1 % MT PSTE: 1.0000 | PASTE | Freq: Two times a day (BID) | OROMUCOSAL | 2 refills | Status: DC

## 2023-01-05 NOTE — Patient Instructions (Signed)
You have been referred to [facility].  Stop chapstick / lipstick / any other application to your lips except for Vaseline to help moisturize.  Trial Kenalog paste to apply to affected area twice daily for 2-3 weeks max.  Recheck sooner if worsening pain, swelling, or any other changes.

## 2023-01-05 NOTE — Progress Notes (Signed)
Subjective:    Patient ID: Courtney Collins, female    DOB: [birth year], 62 y.o.   MRN: [id number]  Chief Complaint  Patient presents with   lip Sore    Pt in office c/o sore on bottom lip and raw over past two months; not healing well and only used chapstick to help.     HPI Patient is in today for concerns of sore on her bottom lip x 2 months. Started as raw / chapped area, now starting to swell and develop white discoloration. Has been using Blistex chapstick without much relief.  Now using Vaseline in place of this.  Currently on amoxicillin, started on this prescription three days ago for upper dental infection.  No food allergies. No mouth or throat or tongue swelling. No cold sore history.   Past Medical History:  Diagnosis Date   Abnormal uterine bleeding    Arthritis    Back muscle spasm 01/12/2011   Fibroids    Hypertension    Hypothyroidism    Obesity (BMI 30-39.9)    Rash and nonspecific skin eruption 09/19/2011   Subacromial bursitis 11/25/2010   Better with steroid injection    Past Surgical History:  Procedure Laterality Date   CYSTOSCOPY N/A 05/23/2018   Procedure: CYSTOSCOPY;  Surgeon: Gerald Leitz, MD;  Location: [facility];  Service: Gynecology;  Laterality: N/A;   NO PAST SURGERIES     ROBOTIC ASSISTED TOTAL HYSTERECTOMY WITH BILATERAL SALPINGO OOPHERECTOMY Bilateral 05/23/2018   Procedure: XI ROBOTIC ASSISTED TOTAL HYSTERECTOMY WITH BILATERAL SALPINGO OOPHORECTOMY;  Surgeon: Gerald Leitz, MD;  Location: [facility];  Service: Gynecology;  Laterality: Bilateral;   SHOULDER OPEN ROTATOR CUFF REPAIR Right 03/09/2016   Procedure: OPEN RIGHT ROTATOR CUFF REPAIR WITH GRAFT AND ANCHORS;  Surgeon: Ranee Gosselin, MD;  Location: [facility];  Service: Orthopedics;  Laterality: Right;    Family History  Problem Relation Age of Onset   Hypertension Mother    Cancer Maternal Aunt    Cancer Maternal Uncle    Healthy Father     Social History   Tobacco Use   Smoking status:  Never   Smokeless tobacco: Never  Vaping Use   Vaping Use: Never used  Substance Use Topics   Alcohol use: No   Drug use: No     No Known Allergies  Review of Systems NEGATIVE UNLESS OTHERWISE INDICATED IN HPI      Objective:     BP (!) 166/98 (BP Location: Left Arm)   Pulse 91   Temp (!) 97.3 F (36.3 C) (Temporal)   Ht 5\' 3"  (1.6 m)   Wt 225 lb (102.1 kg)   LMP 02/15/2018 (Approximate)   SpO2 100%   BMI 39.86 kg/m   Wt Readings from Last 3 Encounters:  01/05/23 225 lb (102.1 kg)  12/21/22 225 lb (102.1 kg)  12/19/22 227 lb (103 kg)    BP Readings from Last 3 Encounters:  01/05/23 (!) 166/98  12/21/22 (!) 147/92  12/19/22 (!) 172/95     Physical Exam Vitals and nursing note reviewed.  Constitutional:      Appearance: Normal appearance.  HENT:     Mouth/Throat:     Mouth: Mucous membranes are moist.     Pharynx: Oropharynx is clear. No oropharyngeal exudate or posterior oropharyngeal erythema.  Skin:    Findings: Lesion (see photo of lip below:) present.  Neurological:     Mental Status: She is alert.          Assessment &  Plan:  Lip lesion -     Ambulatory referral to Dermatology  Other orders -     Triamcinolone Acetonide; Use as directed 1 Application in the mouth or throat 2 (two) times daily.  Dispense: 5 g; Refill: 2   Abnormal appearing discoloration to lower lip and mild edema. ?  Possible lichen versus reaction to Chapstick versus worst-case early malignancy -plan to refer to [facility] dermatology at this time.  In the meantime, patient is going to stop Chapstick, lipstick, other applications except for Vaseline to keep moisturized.  Trial Kenalog paste to apply to affected area twice daily for 2-3 weeks max.  Recheck sooner if worsening pain, swelling, or any other changes.      Johnye Kist M Bence Trapp, PA-C

## 2023-02-02 ENCOUNTER — Ambulatory Visit (INDEPENDENT_AMBULATORY_CARE_PROVIDER_SITE_OTHER): Payer: BLUE CROSS/BLUE SHIELD | Admitting: Dermatology

## 2023-02-02 ENCOUNTER — Encounter: Payer: Self-pay | Admitting: Dermatology

## 2023-02-02 VITALS — BP 168/110 | HR 75

## 2023-02-02 DIAGNOSIS — K130 Diseases of lips: Secondary | ICD-10-CM | POA: Diagnosis not present

## 2023-02-02 MED ORDER — CLOBETASOL PROPIONATE 0.05 % EX OINT: 1.0000 | TOPICAL_OINTMENT | Freq: Two times a day (BID) | CUTANEOUS | 0 refills | Status: DC

## 2023-02-02 NOTE — Patient Instructions (Signed)
Due to recent changes in healthcare laws, you may see results of your pathology and/or laboratory studies on MyChart before the doctors have had a chance to review them. We understand that in some cases there may be results that are confusing or concerning to you. Please understand that not all results are received at the same time and often the doctors may need to interpret multiple results in order to provide you with the best plan of care or course of treatment. Therefore, we ask that you please give us 2 business days to thoroughly review all your results before contacting the office for clarification. Should we see a critical lab result, you will be contacted sooner.   If You Need Anything After Your Visit  If you have any questions or concerns for your doctor, please call our main line at [phone] If no one answers, please leave a voicemail as directed and we will return your call as soon as possible. Messages left after 4 pm will be answered the following business day.   You may also send us a message via MyChart. We typically respond to MyChart messages within 1-2 business days.  For prescription refills, please ask your pharmacy to contact our office. Our fax number is [phone].  If you have an urgent issue when the clinic is closed that cannot wait until the next business day, you can page your doctor at the number below.    Please note that while we do our best to be available for urgent issues outside of office hours, we are not available 24/7.   If you have an urgent issue and are unable to reach us, you may choose to seek medical care at your doctor's office, retail clinic, urgent care center, or emergency room.  If you have a medical emergency, please immediately call 911 or go to the emergency department. In the event of inclement weather, please call our main line at [phone] for an update on the status of any delays or closures.  Dermatology Medication Tips: Please  keep the boxes that topical medications come in in order to help keep track of the instructions about where and how to use these. Pharmacies typically print the medication instructions only on the boxes and not directly on the medication tubes.   If your medication is too expensive, please contact our office at [phone] or send us a message through MyChart.   We are unable to tell what your co-pay for medications will be in advance as this is different depending on your insurance coverage. However, we may be able to find a substitute medication at lower cost or fill out paperwork to get insurance to cover a needed medication.   If a prior authorization is required to get your medication covered by your insurance company, please allow us 1-2 business days to complete this process.  Drug prices often vary depending on where the prescription is filled and some pharmacies may offer cheaper prices.  The website www.goodrx.com contains coupons for medications through different pharmacies. The prices here do not account for what the cost may be with help from insurance (it may be cheaper with your insurance), but the website can give you the price if you did not use any insurance.  - You can print the associated coupon and take it with your prescription to the pharmacy.  - You may also stop by our office during regular business hours and pick up a GoodRx coupon card.  - If you need your   prescription sent electronically to a different pharmacy, notify our office through [facility] MyChart or by phone at [phone]     

## 2023-02-02 NOTE — Progress Notes (Signed)
   New Patient Visit   Subjective  Courtney Collins is a 62 y.o. female who presents for the following: Lesion on bottom Lip  Patient states she has lesion located at the lip that she would like to have examined. Patient reports the areas have been there for  3 months. She reports the areas are bothersome. She states that the areas have spread. Patient reports she first treated the area with Blistex and vasoline with no improvement. She was then evaluated by her pcp (Alyssa Allwardt, PA-C) who prescribed Triamcinolone Acetone to apply 2 times daily but after 3 days patient stopped because she felt it made the area worst. She denies biting and/or injury to the lip. Patient denies Hx of bx. Patient denies family history of skin cancer(s).    The following portions of the chart were reviewed this encounter and updated as appropriate: medications, allergies, medical history  Review of Systems:  No other skin or systemic complaints except as noted in HPI or Assessment and Plan.  Objective  Well appearing patient in no apparent distress; mood and affect are within normal limits.   A focused examination was performed of the following areas: Bottom Lip  Relevant exam findings are noted in the Assessment and Plan.      Assessment & Plan    Cheilitis Exam: scaly/gritty  papule on lower lip  Treatment Plan: - We will prescribe Clobetasol Ointment, apply small amount 2 times daily - We follow up in 3 weeks, if no improvement, we will proceed with bx  Cheilitis  Related Medications clobetasol ointment (TEMOVATE) 0.05 % Apply 1 Application topically 2 (two) times daily. STOP USING AFTER 3 WEEKS   Return in about 3 weeks (around 02/23/2023) for Cheilitis F/U.    Documentation: I have reviewed the above documentation for accuracy and completeness, and I agree with the above.  Stasia Cavalier, am acting as scribe for Langston Reusing, DO.  Langston Reusing, DO

## 2023-02-06 ENCOUNTER — Encounter: Payer: Self-pay | Admitting: Dermatology

## 2023-02-06 ENCOUNTER — Other Ambulatory Visit: Payer: Self-pay

## 2023-02-06 DIAGNOSIS — K130 Diseases of lips: Secondary | ICD-10-CM

## 2023-02-06 MED ORDER — CLOBETASOL PROPIONATE 0.05 % EX OINT: 1.0000 | TOPICAL_OINTMENT | Freq: Two times a day (BID) | CUTANEOUS | 0 refills | Status: DC

## 2023-02-12 ENCOUNTER — Encounter: Payer: Self-pay | Admitting: Dermatology

## 2023-02-18 ENCOUNTER — Other Ambulatory Visit: Payer: Self-pay | Admitting: Family

## 2023-02-18 DIAGNOSIS — I10 Essential (primary) hypertension: Secondary | ICD-10-CM

## 2023-02-28 ENCOUNTER — Encounter: Payer: Self-pay | Admitting: Dermatology

## 2023-02-28 ENCOUNTER — Encounter: Payer: BLUE CROSS/BLUE SHIELD | Admitting: Dermatology

## 2023-02-28 NOTE — Patient Instructions (Addendum)
Due to recent changes in healthcare laws, you may see results of your pathology and/or laboratory studies on MyChart before the doctors have had a chance to review them. We understand that in some cases there may be results that are confusing or concerning to you. Please understand that not all results are received at the same time and often the doctors may need to interpret multiple results in order to provide you with the best plan of care or course of treatment. Therefore, we ask that you please give us 2 business days to thoroughly review all your results before contacting the office for clarification. Should we see a critical lab result, you will be contacted sooner.   If You Need Anything After Your Visit  If you have any questions or concerns for your doctor, please call our main line at [phone] If no one answers, please leave a voicemail as directed and we will return your call as soon as possible. Messages left after 4 pm will be answered the following business day.   You may also send us a message via MyChart. We typically respond to MyChart messages within 1-2 business days.  For prescription refills, please ask your pharmacy to contact our office. Our fax number is [phone].  If you have an urgent issue when the clinic is closed that cannot wait until the next business day, you can page your doctor at the number below.    Please note that while we do our best to be available for urgent issues outside of office hours, we are not available 24/7.   If you have an urgent issue and are unable to reach us, you may choose to seek medical care at your doctor's office, retail clinic, urgent care center, or emergency room.  If you have a medical emergency, please immediately call 911 or go to the emergency department. In the event of inclement weather, please call our main line at [phone] for an update on the status of any delays or closures.  Dermatology Medication Tips: Please  keep the boxes that topical medications come in in order to help keep track of the instructions about where and how to use these. Pharmacies typically print the medication instructions only on the boxes and not directly on the medication tubes.   If your medication is too expensive, please contact our office at [phone] or send us a message through MyChart.   We are unable to tell what your co-pay for medications will be in advance as this is different depending on your insurance coverage. However, we may be able to find a substitute medication at lower cost or fill out paperwork to get insurance to cover a needed medication.   If a prior authorization is required to get your medication covered by your insurance company, please allow us 1-2 business days to complete this process.  Drug prices often vary depending on where the prescription is filled and some pharmacies may offer cheaper prices.  The website www.goodrx.com contains coupons for medications through different pharmacies. The prices here do not account for what the cost may be with help from insurance (it may be cheaper with your insurance), but the website can give you the price if you did not use any insurance.  - You can print the associated coupon and take it with your prescription to the pharmacy.  - You may also stop by our office during regular business hours and pick up a GoodRx coupon card.  - If you need your   prescription sent electronically to a different pharmacy, notify our office through [facility] MyChart or by phone at [phone]     

## 2023-02-28 NOTE — Progress Notes (Deleted)
   Follow-Up Visit   Subjective  Courtney Collins is a 62 y.o. female who presents for the following: Cheilitis   Patient present today for follow up visit for Cheilitis . Patient was last evaluated on 02/02/23. Patient reports sxs are Better/not at goal/ resolved. Patient reports no medication changes.  The following portions of the chart were reviewed this encounter and updated as appropriate: medications, allergies, medical history  Review of Systems:  No other skin or systemic complaints except as noted in HPI or Assessment and Plan.  Objective  Well appearing patient in no apparent distress; mood and affect are within normal limits.   A focused examination was performed of the following areas: Lips  Relevant exam findings are noted in the Assessment and Plan.    Assessment & Plan   Cheilitis  Exam: Scaly/Gritty Papule on Lower Lip  Treatment Plan: -   No follow-ups on file.  Documentation: I have reviewed the above documentation for accuracy and completeness, and I agree with the above.  Stasia Cavalier, am acting as scribe for Langston Reusing, DO.  Langston Reusing, DO

## 2023-02-28 NOTE — Progress Notes (Signed)
 This encounter was created in error - please disregard.

## 2023-03-01 ENCOUNTER — Other Ambulatory Visit: Payer: Self-pay | Admitting: Family

## 2023-03-01 DIAGNOSIS — I10 Essential (primary) hypertension: Secondary | ICD-10-CM

## 2023-04-17 ENCOUNTER — Other Ambulatory Visit: Payer: Self-pay | Admitting: Family

## 2023-04-17 DIAGNOSIS — E782 Mixed hyperlipidemia: Secondary | ICD-10-CM

## 2023-04-17 DIAGNOSIS — E039 Hypothyroidism, unspecified: Secondary | ICD-10-CM

## 2023-05-19 ENCOUNTER — Other Ambulatory Visit: Payer: Self-pay | Admitting: Family

## 2023-05-19 DIAGNOSIS — Z1212 Encounter for screening for malignant neoplasm of rectum: Secondary | ICD-10-CM

## 2023-05-19 DIAGNOSIS — Z1211 Encounter for screening for malignant neoplasm of colon: Secondary | ICD-10-CM

## 2023-05-25 ENCOUNTER — Other Ambulatory Visit: Payer: Self-pay | Admitting: Family

## 2023-05-25 DIAGNOSIS — I10 Essential (primary) hypertension: Secondary | ICD-10-CM

## 2023-05-30 ENCOUNTER — Encounter: Payer: Self-pay | Admitting: Family

## 2023-05-30 ENCOUNTER — Other Ambulatory Visit: Payer: Self-pay

## 2023-05-30 DIAGNOSIS — I10 Essential (primary) hypertension: Secondary | ICD-10-CM

## 2023-05-31 LAB — COLOGUARD: COLOGUARD: NEGATIVE

## 2023-05-31 MED ORDER — METOPROLOL SUCCINATE ER 50 MG PO TB24: 100.0000 mg | ORAL_TABLET | Freq: Every evening | ORAL | 0 refills | Status: DC

## 2023-05-31 NOTE — Telephone Encounter (Signed)
This encounter was created in error - please disregard.

## 2023-06-06 ENCOUNTER — Other Ambulatory Visit: Payer: Self-pay | Admitting: Family

## 2023-06-06 DIAGNOSIS — I10 Essential (primary) hypertension: Secondary | ICD-10-CM

## 2023-06-08 ENCOUNTER — Other Ambulatory Visit: Payer: Self-pay

## 2023-06-08 ENCOUNTER — Ambulatory Visit (INDEPENDENT_AMBULATORY_CARE_PROVIDER_SITE_OTHER): Payer: BLUE CROSS/BLUE SHIELD | Admitting: Family

## 2023-06-08 ENCOUNTER — Encounter: Payer: Self-pay | Admitting: Family

## 2023-06-08 VITALS — BP 142/88 | HR 81 | Temp 97.8°F | Ht 63.0 in | Wt 229.0 lb

## 2023-06-08 DIAGNOSIS — E782 Mixed hyperlipidemia: Secondary | ICD-10-CM

## 2023-06-08 DIAGNOSIS — I10 Essential (primary) hypertension: Secondary | ICD-10-CM | POA: Diagnosis not present

## 2023-06-08 DIAGNOSIS — E039 Hypothyroidism, unspecified: Secondary | ICD-10-CM

## 2023-06-08 DIAGNOSIS — G44039 Episodic paroxysmal hemicrania, not intractable: Secondary | ICD-10-CM

## 2023-06-08 DIAGNOSIS — Z6841 Body Mass Index (BMI) 40.0 and over, adult: Secondary | ICD-10-CM

## 2023-06-08 LAB — BASIC METABOLIC PANEL
BUN: 14 mg/dL (ref 6–23)
CO2: 29 meq/L (ref 19–32)
Calcium: 10.4 mg/dL (ref 8.4–10.5)
Chloride: 100 meq/L (ref 96–112)
Creatinine, Ser: 0.71 mg/dL (ref 0.40–1.20)
GFR: 90.17 mL/min (ref >60.00)
Glucose, Bld: 124 mg/dL — ABNORMAL HIGH (ref 70–99)
Potassium: 3.9 meq/L (ref 3.5–5.1)
Sodium: 138 meq/L (ref 135–145)

## 2023-06-08 LAB — TSH: TSH: 0.07 u[IU]/mL — ABNORMAL LOW (ref 0.35–5.50)

## 2023-06-08 LAB — T4, FREE: Free T4: 1.1 ng/dL (ref 0.60–1.60)

## 2023-06-08 MED ORDER — AMLODIPINE BESYLATE 5 MG PO TABS: 5.0000 mg | ORAL_TABLET | Freq: Two times a day (BID) | ORAL | 1 refills | Status: DC

## 2023-06-08 MED ORDER — METOPROLOL SUCCINATE ER 50 MG PO TB24: 100.0000 mg | ORAL_TABLET | Freq: Every evening | ORAL | Status: DC

## 2023-06-08 MED ORDER — OLMESARTAN MEDOXOMIL-HCTZ 40-25 MG PO TABS: 1.0000 | ORAL_TABLET | Freq: Every morning | ORAL | 1 refills | Status: DC

## 2023-06-08 NOTE — Assessment & Plan Note (Addendum)
chronic, BP still a little high Olmesartan-HCTZ 40-25mg , Amlodipine 5mg , Metoprolol 50mg  bid. home readings mostly 140-150/60-90, occasionally 135/60-80.  Increase the Amlodipine to twice a day. BMP today all refills sent in f/u 3 mos

## 2023-06-08 NOTE — Patient Instructions (Addendum)
It was very nice to see you today!  Your blood pressure is a little elevated still. Take the Amlodipine in the am and one pill in the evening. Keep taking the Olmesartan-HCTZ in the morning with your first  your first dose of Metoprolol. Take the Metoprolol twice a day as you have been. I have sent in your refills.  Keep working on losing weight! This will help your blood pressure and joint pain. See the attached handout for tips.  Schedule a 3 month follow up visit.   PLEASE NOTE:  If you had any lab tests please let us know if you have not heard back within a few days. You may see your results on MyChart before we have a chance to review them but we will give you a call once they are reviewed by [place]. If we ordered any referrals today, please let us know if you have not heard from their office within the next week.

## 2023-06-08 NOTE — Progress Notes (Signed)
Patient ID: Courtney Collins, female    DOB: 05/14/1961, 62 y.o.   MRN: [id number]  Chief Complaint  Patient presents with   Hypertension    Pt here for BP check.    Hypothyroidism   *Discussed the use of AI scribe software for clinical note transcription with the patient, who gave verbal consent to proceed.  History of Present Illness   The patient, with a history of hypertension, hyperlipidemia, and hypothyroidism, presents with a new onset of fleeting head pain. She describes the pain as sharp, lasting about a minute, and occurring about twice a day. The pain is located on the left side of the head, sometimes behind the eye and sometimes in the back of the head. The head pain occurs at any time, regardless of activity, and have been increasing in frequency to daily occurrences. The patient notes that the onset of these headaches was after she had COVID-19 about a year ago. She also reports feeling shaky during these episodes.  She was seen in the ED back in May and CT head scan was normal at that time.  She also reports having an eye exam in the past year which was normal. The patient is also managing her hypertension with Amlodipine, Metoprolol, & olmesartan-HCTZ and hypothyroidism with levothyroxine. She reports home BP readings in 140-150/60-90 range.  She is also trying to lose weight and manage her back pain. She states her daughter has been helping her with nutrition ideas.      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 No associated vision changes. Some associated shakiness. Onset after COVID-19 infection approximately a year ago. Prior CT head was normal. Believe related to stress.  -Advised pt to work on stress reduction (exercise, deep breathing, yoga), hydrating well daily, not skipping meals. -Check blood pressure during episodes of headache if possible, notify  office of high readings.  Hypertension - On olmesartan-HCTZ 40-25mg , Amlodipine 5mg , Metoprolol 50mg  bid. Pt reports home readings mostly 140-150/60-90, occasionally 135/60-80.  -Increase the Amlodipine to twice a day. -Complete blood work today, including electrolytes and kidney function. -Sending refills of all BP meds. -Continue current medication regimen & monitoring BP at home, calling office if readings continue to be higher than 135/90. -F/U in 3 mos  Hypothyroidism - On levothyroxine. No recent thyroid function tests provided in the conversation. -Check TSH and T4 today to assess current thyroid function. -No refill needed today. -F/U in 75yr  Hyperlipidemia - On atorvastatin 20mg  daily. Last lipid panel 1 yr ago wnl, will recheck this next year. -Continue current medication regimen. -F/U in 6 mos.  Weight Management - Patient reports difficulty with weight loss due to back pain. -Encourage continued efforts with diet modification, focusing on portion control and limiting intake of carbohydrates and sweets. -Offered referral to a nutritionist for further dietary guidance if needed.      Subjective:    Outpatient Medications Prior to Visit  Medication Sig Dispense Refill   atorvastatin (LIPITOR) 20 MG tablet Take 1 tablet by mouth daily. 90 tablet 0   clobetasol ointment (TEMOVATE) 0.05 % Apply 1 Application topically 2 (two) times daily. Apply 0.714 grams to BOTTOM LIP 2 times daily STOP USING AFTER 3 WEEKS 30 g 0   levothyroxine (SYNTHROID) 150 MCG tablet Take 1 tablet by mouth once daily. 90 tablet 0   meloxicam (MOBIC) 7.5  MG tablet Take 1 to 2 tablets by mouth daily as needed for pain. 60 tablet 1   triamcinolone (KENALOG) 0.1 % paste Use as directed 1 Application in the mouth or throat 2 (two) times daily. 5 g 2   amLODipine (NORVASC) 5 MG tablet Take 1 tablet (5 mg total) by mouth in the morning and at bedtime. START with 1 pill in evening or qhs, for 1 week, if no  rash, add 1 pill in am. 60 tablet 2   metoprolol succinate (TOPROL-XL) 50 MG 24 hr tablet Take 2 tablets by mouth every evening. 60 tablet 0   olmesartan-hydrochlorothiazide (BENICAR HCT) 40-25 MG tablet Take 1 tablet by mouth in the morning. 90 tablet 0   amoxicillin (AMOXIL) 875 MG tablet Take 875 mg by mouth 2 (two) times daily. (Patient not taking: Reported on 06/08/2023)     No facility-administered medications prior to visit.   Past Medical History:  Diagnosis Date   Abnormal uterine bleeding    Arthritis    Back muscle spasm 01/12/2011   Fibroids    Hypertension    Hypothyroidism    Obesity (BMI 30-39.9)    Rash and nonspecific skin eruption 09/19/2011   Subacromial bursitis 11/25/2010   Better with steroid injection   Past Surgical History:  Procedure Laterality Date   CYSTOSCOPY N/A 05/23/2018   Procedure: CYSTOSCOPY;  Surgeon: Gerald Leitz, MD;  Location: [facility];  Service: Gynecology;  Laterality: N/A;   NO PAST SURGERIES     ROBOTIC ASSISTED TOTAL HYSTERECTOMY WITH BILATERAL SALPINGO OOPHERECTOMY Bilateral 05/23/2018   Procedure: XI ROBOTIC ASSISTED TOTAL HYSTERECTOMY WITH BILATERAL SALPINGO OOPHORECTOMY;  Surgeon: Gerald Leitz, MD;  Location: [facility];  Service: Gynecology;  Laterality: Bilateral;   SHOULDER OPEN ROTATOR CUFF REPAIR Right 03/09/2016   Procedure: OPEN RIGHT ROTATOR CUFF REPAIR WITH GRAFT AND ANCHORS;  Surgeon: Ranee Gosselin, MD;  Location: [facility];  Service: Orthopedics;  Laterality: Right;   No Known Allergies    Objective:    Physical Exam Vitals and nursing note reviewed.  Constitutional:      Appearance: Normal appearance. She is obese.  Cardiovascular:     Rate and Rhythm: Normal rate and regular rhythm.  Pulmonary:     Effort: Pulmonary effort is normal.     Breath sounds: Normal breath sounds.  Musculoskeletal:        General: Normal range of motion.  Skin:    General: Skin is warm and dry.  Neurological:     Mental Status: She is alert.   Psychiatric:        Mood and Affect: Mood normal.        Behavior: Behavior normal.   BP (!) 142/88   Pulse 81   Temp 97.8 F (36.6 C)   Ht 5\' 3"  (1.6 m)   Wt 229 lb (103.9 kg)   LMP 02/15/2018 (Approximate)   SpO2 96%   BMI 40.57 kg/m  Wt Readings from Last 3 Encounters:  06/08/23 229 lb (103.9 kg)  01/05/23 225 lb (102.1 kg)  12/21/22 225 lb (102.1 kg)      Dulce Sellar, NP

## 2023-06-08 NOTE — Assessment & Plan Note (Signed)
Patient reports difficulty with weight loss due to back pain. -Encourage continued efforts with diet modification, focusing on portion control and limiting intake of carbohydrates and sweets. -Offered referral to a nutritionist for further dietary guidance if needed.  F/U prn

## 2023-06-08 NOTE — Assessment & Plan Note (Signed)
chronic taking Levo qd last  TSH & T4 wnl, 01/2022, rechecking today sending refill f/u 1 yr

## 2023-06-08 NOTE — Assessment & Plan Note (Signed)
chronic taking Lipitor 20mg  qd, last lipids in 2023 wnl sending refill f/u 6 mos with fasting labs

## 2023-06-09 ENCOUNTER — Encounter: Payer: Self-pay | Admitting: Family

## 2023-06-12 ENCOUNTER — Encounter: Payer: Self-pay | Admitting: Family

## 2023-07-02 ENCOUNTER — Other Ambulatory Visit: Payer: Self-pay | Admitting: Family

## 2023-07-02 DIAGNOSIS — I10 Essential (primary) hypertension: Secondary | ICD-10-CM

## 2023-07-03 NOTE — Telephone Encounter (Signed)
Called patient to confirm that the pharmacy requesting the refill is her correct pharmacy. She stated that [place] is the pharmacy that her insurance will pay for if she is taking something long term. Sent Rx refill the the requested pharmacy.

## 2023-07-18 ENCOUNTER — Other Ambulatory Visit: Payer: Self-pay | Admitting: Family

## 2023-07-18 DIAGNOSIS — E782 Mixed hyperlipidemia: Secondary | ICD-10-CM

## 2023-07-18 DIAGNOSIS — E039 Hypothyroidism, unspecified: Secondary | ICD-10-CM

## 2023-10-17 ENCOUNTER — Other Ambulatory Visit: Payer: Self-pay

## 2023-10-17 ENCOUNTER — Other Ambulatory Visit: Payer: Self-pay | Admitting: Family

## 2023-10-17 DIAGNOSIS — E039 Hypothyroidism, unspecified: Secondary | ICD-10-CM

## 2023-10-17 DIAGNOSIS — E782 Mixed hyperlipidemia: Secondary | ICD-10-CM

## 2023-10-31 ENCOUNTER — Other Ambulatory Visit: Payer: Self-pay | Admitting: Family

## 2023-10-31 DIAGNOSIS — I10 Essential (primary) hypertension: Secondary | ICD-10-CM

## 2023-11-06 ENCOUNTER — Encounter (HOSPITAL_COMMUNITY): Payer: Self-pay

## 2023-11-06 ENCOUNTER — Ambulatory Visit (INDEPENDENT_AMBULATORY_CARE_PROVIDER_SITE_OTHER)

## 2023-11-06 ENCOUNTER — Ambulatory Visit (HOSPITAL_COMMUNITY): Admission: EM | Admit: 2023-11-06 | Discharge: 2023-11-06 | Disposition: A | Discharge status: Home / Self Care

## 2023-11-06 DIAGNOSIS — R053 Chronic cough: Secondary | ICD-10-CM | POA: Diagnosis not present

## 2023-11-06 DIAGNOSIS — G9331 Postviral fatigue syndrome: Secondary | ICD-10-CM | POA: Diagnosis not present

## 2023-11-06 MED ORDER — PREDNISONE 20 MG PO TABS: 40.0000 mg | ORAL_TABLET | Freq: Every day | ORAL | 0 refills | Status: AC

## 2023-11-06 MED ORDER — BENZONATATE 200 MG PO CAPS: 200.0000 mg | ORAL_CAPSULE | Freq: Three times a day (TID) | ORAL | 0 refills | Status: DC | PRN

## 2023-11-06 MED ORDER — AZELASTINE HCL 0.1 % NA SOLN: 1.0000 | Freq: Two times a day (BID) | NASAL | 1 refills | Status: DC

## 2023-11-06 MED ORDER — ALBUTEROL SULFATE HFA 108 (90 BASE) MCG/ACT IN AERS: 1.0000 | INHALATION_SPRAY | Freq: Four times a day (QID) | RESPIRATORY_TRACT | 0 refills | Status: DC | PRN

## 2023-11-06 NOTE — ED Triage Notes (Signed)
 Patient here today with complaints of productive coughing, chest congestion, sob, and wheezing x 1 week. Patient has been taking theraflu, dayquil with no relief. Coworker was coughing.

## 2023-11-06 NOTE — ED Provider Notes (Signed)
 [facility]  Note:  This document was prepared using Dragon voice recognition software and may include unintentional dictation errors.  MRN: [id number] DOB: 12-26-1960  Subjective:   Courtney Collins is a 63 y.o. female presenting for persistent coughing x 7 to 10 days.  Patient reports intermittent wheezing, nocturnal cough, laryngitis.  Patient reports that one of her coworkers had a cough prior to the onset of her symptoms other than that no other known sick exposure.  Has been taking DayQuil and TheraFlu with no improvement to symptoms.  She reports occasional production with cough and chest congestion.  No shortness of breath, chest pain, weakness, dizziness.  No current facility-administered medications for this encounter.  Current Outpatient Medications:    azelastine (ASTELIN) 0.1 % nasal spray, Place 1 spray into both nostrils 2 (two) times daily. Use in each nostril as directed, Disp: 30 mL, Rfl: 1   benzonatate (TESSALON) 200 MG capsule, Take 1 capsule (200 mg total) by mouth 3 (three) times daily as needed for cough., Disp: 20 capsule, Rfl: 0   predniSONE (DELTASONE) 20 MG tablet, Take 2 tablets (40 mg total) by mouth daily for 5 days., Disp: 10 tablet, Rfl: 0   albuterol (VENTOLIN HFA) 108 (90 Base) MCG/ACT inhaler, Inhale 1-2 puffs into the lungs every 6 (six) hours as needed for wheezing or shortness of breath., Disp: 6.7 g, Rfl: 0   amLODipine (NORVASC) 5 MG tablet, Take 1 tablet by mouth in the morning and at bedtime., Disp: 30 tablet, Rfl: 0   atorvastatin (LIPITOR) 20 MG tablet, Take 1 tablet by mouth daily., Disp: 90 tablet, Rfl: 0   levothyroxine (SYNTHROID) 150 MCG tablet, Take 1 tablet by mouth once daily., Disp: 90 tablet, Rfl: 0   metoprolol succinate (TOPROL-XL) 50 MG 24 hr tablet, Take 2 tablets (100 mg total) by mouth every evening., Disp: 180 tablet, Rfl: 1   olmesartan-hydrochlorothiazide (BENICAR HCT) 40-25 MG tablet, Take 1 tablet by mouth in the  morning., Disp: 90 tablet, Rfl: 1   triamcinolone (KENALOG) 0.1 % paste, Use as directed 1 Application in the mouth or throat 2 (two) times daily., Disp: 5 g, Rfl: 2   No Known Allergies  Past Medical History:  Diagnosis Date   Abnormal uterine bleeding    Arthritis    Back muscle spasm 01/12/2011   Fibroids    Hypertension    Hypothyroidism    Obesity (BMI 30-39.9)    Rash and nonspecific skin eruption 09/19/2011   Subacromial bursitis 11/25/2010   Better with steroid injection     Past Surgical History:  Procedure Laterality Date   CYSTOSCOPY N/A 05/23/2018   Procedure: CYSTOSCOPY;  Surgeon: Gerald Leitz, MD;  Location: [facility];  Service: Gynecology;  Laterality: N/A;   NO PAST SURGERIES     ROBOTIC ASSISTED TOTAL HYSTERECTOMY WITH BILATERAL SALPINGO OOPHERECTOMY Bilateral 05/23/2018   Procedure: XI ROBOTIC ASSISTED TOTAL HYSTERECTOMY WITH BILATERAL SALPINGO OOPHORECTOMY;  Surgeon: Gerald Leitz, MD;  Location: [facility];  Service: Gynecology;  Laterality: Bilateral;   SHOULDER OPEN ROTATOR CUFF REPAIR Right 03/09/2016   Procedure: OPEN RIGHT ROTATOR CUFF REPAIR WITH GRAFT AND ANCHORS;  Surgeon: Ranee Gosselin, MD;  Location: [facility];  Service: Orthopedics;  Laterality: Right;    Family History  Problem Relation Age of Onset   Hypertension Mother    Cancer Maternal Aunt    Cancer Maternal Uncle    Healthy Father     Social History   Tobacco Use   Smoking status:  Never   Smokeless tobacco: Never  Vaping Use   Vaping status: Never Used  Substance Use Topics   Alcohol use: No   Drug use: No    ROS Refer to HPI for ROS details.  Objective:   Vitals: BP (!) 182/99 (BP Location: Left Arm)   Pulse 92   Temp 98.6 F (37 C) (Oral)   Resp 16   LMP 02/15/2018 (Approximate)   SpO2 94%   Physical Exam Vitals and nursing note reviewed.  Constitutional:      General: She is not in acute distress.    Appearance: Normal appearance. She is well-developed. She is not ill-appearing  or toxic-appearing.  HENT:     Head: Normocephalic.  Cardiovascular:     Rate and Rhythm: Normal rate and regular rhythm.     Heart sounds: No murmur heard. Pulmonary:     Effort: Pulmonary effort is normal. No respiratory distress.     Breath sounds: Normal breath sounds. No stridor. No wheezing, rhonchi or rales.  Skin:    General: Skin is warm and dry.  Neurological:     General: No focal deficit present.     Mental Status: She is alert and oriented to person, place, and time.  Psychiatric:        Mood and Affect: Mood normal.        Behavior: Behavior normal.     Procedures  No results found for this or any previous visit (from the past 24 hours).  Assessment and Plan :   PDMP not reviewed this encounter.  1. Persistent cough   2. Postviral syndrome    1. Persistent cough (Primary) - DG Chest 2 View x-ray shows no acute cardiopulmonary processes, no sign of consolidation or pneumonia. - benzonatate (TESSALON) 200 MG capsule; Take 1 capsule (200 mg total) by mouth 3 (three) times daily as needed for cough.  Dispense: 20 capsule; Refill: 0  2. Postviral syndrome - azelastine (ASTELIN) 0.1 % nasal spray; Place 1 spray into both nostrils 2 (two) times daily. Use in each nostril as directed  Dispense: 30 mL; Refill: 1 - predniSONE (DELTASONE) 20 MG tablet; Take 2 tablets (40 mg total) by mouth daily for 5 days.  Dispense: 10 tablet; Refill: 0 - albuterol (VENTOLIN HFA) 108 (90 Base) MCG/ACT inhaler; Inhale 1-2 puffs into the lungs every 6 (six) hours as needed for wheezing or shortness of breath.  Dispense: 6.7 g; Refill: 0 -Continue to monitor symptoms for any changes severity if there is any escalation of current symptoms or development of new symptoms follow-up for further evaluation and management.  Lucky Cowboy   [place], [place], [place] 11/06/23 1010

## 2023-11-06 NOTE — Discharge Instructions (Addendum)
 1. Persistent cough (Primary) - DG Chest 2 View x-ray shows no acute cardiopulmonary processes, no sign of consolidation or pneumonia. - benzonatate (TESSALON) 200 MG capsule; Take 1 capsule (200 mg total) by mouth 3 (three) times daily as needed for cough.  Dispense: 20 capsule; Refill: 0  2. Postviral syndrome - azelastine (ASTELIN) 0.1 % nasal spray; Place 1 spray into both nostrils 2 (two) times daily. Use in each nostril as directed  Dispense: 30 mL; Refill: 1 - predniSONE (DELTASONE) 20 MG tablet; Take 2 tablets (40 mg total) by mouth daily for 5 days.  Dispense: 10 tablet; Refill: 0 - albuterol (VENTOLIN HFA) 108 (90 Base) MCG/ACT inhaler; Inhale 1-2 puffs into the lungs every 6 (six) hours as needed for wheezing or shortness of breath.  Dispense: 6.7 g; Refill: 0 -Continue to monitor symptoms for any changes severity if there is any escalation of current symptoms or development of new symptoms follow-up for further evaluation and management.

## 2023-11-21 ENCOUNTER — Other Ambulatory Visit: Payer: Self-pay | Admitting: Family

## 2023-11-21 ENCOUNTER — Encounter: Admitting: Family Medicine

## 2023-11-21 DIAGNOSIS — I10 Essential (primary) hypertension: Secondary | ICD-10-CM

## 2023-12-05 ENCOUNTER — Telehealth: Payer: Self-pay

## 2023-12-05 ENCOUNTER — Other Ambulatory Visit: Payer: Self-pay

## 2023-12-05 DIAGNOSIS — E039 Hypothyroidism, unspecified: Secondary | ICD-10-CM

## 2023-12-05 MED ORDER — LEVOTHYROXINE SODIUM 150 MCG PO TABS: 150.0000 ug | ORAL_TABLET | Freq: Every day | ORAL | 0 refills | Status: DC

## 2023-12-05 NOTE — Telephone Encounter (Signed)
 Copied from CRM [id number]. Topic: Clinical - Medication Question >> Dec 05, 2023 10:30 AM Adonis Hoot wrote: Reason for CRM: Patient changed insurance and is now using  [facility] - [place], [place] - [address]  Phone: [phone] Fax: [phone]  She would like to know if prescription for  levothyroxine  (SYNTHROID ) 150 MCG tablet could be sent to this pharmacy?    RX Sent,

## 2023-12-05 NOTE — Telephone Encounter (Signed)
 Copied from CRM [id number]. Topic: Clinical - Medication Question >> Dec 05, 2023 10:30 AM Adonis Hoot wrote: Reason for CRM: Patient changed insurance and is now using  [facility] - [place], [place] - [address]  Phone: [phone] Fax: [phone]  She would like to know if prescription for  levothyroxine  (SYNTHROID ) 150 MCG tablet could be sent to this pharmacy?

## 2023-12-20 ENCOUNTER — Ambulatory Visit (INDEPENDENT_AMBULATORY_CARE_PROVIDER_SITE_OTHER): Admitting: Family

## 2023-12-20 ENCOUNTER — Encounter: Payer: Self-pay | Admitting: Family

## 2023-12-20 VITALS — BP 137/89 | HR 92 | Temp 97.0°F | Ht 63.0 in | Wt 224.0 lb

## 2023-12-20 DIAGNOSIS — E782 Mixed hyperlipidemia: Secondary | ICD-10-CM

## 2023-12-20 DIAGNOSIS — I10 Essential (primary) hypertension: Secondary | ICD-10-CM | POA: Diagnosis not present

## 2023-12-20 DIAGNOSIS — Z114 Encounter for screening for human immunodeficiency virus [HIV]: Secondary | ICD-10-CM

## 2023-12-20 DIAGNOSIS — E039 Hypothyroidism, unspecified: Secondary | ICD-10-CM | POA: Diagnosis not present

## 2023-12-20 DIAGNOSIS — Z780 Asymptomatic menopausal state: Secondary | ICD-10-CM

## 2023-12-20 DIAGNOSIS — J301 Allergic rhinitis due to pollen: Secondary | ICD-10-CM

## 2023-12-20 DIAGNOSIS — R232 Flushing: Secondary | ICD-10-CM

## 2023-12-20 DIAGNOSIS — Z0000 Encounter for general adult medical examination without abnormal findings: Secondary | ICD-10-CM | POA: Diagnosis not present

## 2023-12-20 DIAGNOSIS — Z1231 Encounter for screening mammogram for malignant neoplasm of breast: Secondary | ICD-10-CM

## 2023-12-20 DIAGNOSIS — Z1159 Encounter for screening for other viral diseases: Secondary | ICD-10-CM | POA: Diagnosis not present

## 2023-12-20 DIAGNOSIS — N951 Menopausal and female climacteric states: Secondary | ICD-10-CM

## 2023-12-20 LAB — LIPID PANEL
Cholesterol: 179 mg/dL (ref 0–200)
HDL: 42.7 mg/dL (ref >39.00)
LDL Cholesterol: 97 mg/dL (ref 0–99)
NonHDL: 136.21
Total CHOL/HDL Ratio: 4
Triglycerides: 194 mg/dL — ABNORMAL HIGH (ref 0.0–149.0)
VLDL: 38.8 mg/dL (ref 0.0–40.0)

## 2023-12-20 LAB — CBC WITH DIFFERENTIAL/PLATELET
Basophils Absolute: 0.1 10*3/uL (ref 0.0–0.1)
Basophils Relative: 1 % (ref 0.0–3.0)
Eosinophils Absolute: 0.2 10*3/uL (ref 0.0–0.7)
Eosinophils Relative: 1.8 % (ref 0.0–5.0)
HCT: 44.9 % (ref 36.0–46.0)
Hemoglobin: 15.2 g/dL — ABNORMAL HIGH (ref 12.0–15.0)
Lymphocytes Relative: 13.2 % (ref 12.0–46.0)
Lymphs Abs: 1.7 10*3/uL (ref 0.7–4.0)
MCHC: 34 g/dL (ref 30.0–36.0)
MCV: 87.8 fl (ref 78.0–100.0)
Monocytes Absolute: 0.8 10*3/uL (ref 0.1–1.0)
Monocytes Relative: 6.4 % (ref 3.0–12.0)
Neutro Abs: 10.1 10*3/uL — ABNORMAL HIGH (ref 1.4–7.7)
Neutrophils Relative %: 77.6 % — ABNORMAL HIGH (ref 43.0–77.0)
Platelets: 430 10*3/uL — ABNORMAL HIGH (ref 150.0–400.0)
RBC: 5.11 Mil/uL (ref 3.87–5.11)
RDW: 13.8 % (ref 11.5–15.5)
WBC: 12.9 10*3/uL — ABNORMAL HIGH (ref 4.0–10.5)

## 2023-12-20 LAB — COMPREHENSIVE METABOLIC PANEL WITH GFR
ALT: 31 U/L (ref 0–35)
AST: 25 U/L (ref 0–37)
Albumin: 5 g/dL (ref 3.5–5.2)
Alkaline Phosphatase: 101 U/L (ref 39–117)
BUN: 12 mg/dL (ref 6–23)
CO2: 26 meq/L (ref 19–32)
Calcium: 10.1 mg/dL (ref 8.4–10.5)
Chloride: 100 meq/L (ref 96–112)
Creatinine, Ser: 0.65 mg/dL (ref 0.40–1.20)
GFR: 93.02 mL/min (ref >60.00)
Glucose, Bld: 124 mg/dL — ABNORMAL HIGH (ref 70–99)
Potassium: 3.6 meq/L (ref 3.5–5.1)
Sodium: 137 meq/L (ref 135–145)
Total Bilirubin: 0.6 mg/dL (ref 0.2–1.2)
Total Protein: 8.1 g/dL (ref 6.0–8.3)

## 2023-12-20 LAB — TSH: TSH: 0.05 u[IU]/mL — ABNORMAL LOW (ref 0.35–5.50)

## 2023-12-20 MED ORDER — AMLODIPINE BESYLATE 5 MG PO TABS: 5.0000 mg | ORAL_TABLET | Freq: Every day | ORAL | 1 refills | Status: DC

## 2023-12-20 MED ORDER — LEVOTHYROXINE SODIUM 150 MCG PO TABS: 150.0000 ug | ORAL_TABLET | Freq: Every day | ORAL | 1 refills | Status: DC

## 2023-12-20 MED ORDER — OLMESARTAN MEDOXOMIL-HCTZ 40-25 MG PO TABS: 1.0000 | ORAL_TABLET | Freq: Every morning | ORAL | 0 refills | Status: DC

## 2023-12-20 MED ORDER — CLONIDINE HCL 0.1 MG PO TABS: 0.1000 mg | ORAL_TABLET | Freq: Every day | ORAL | 1 refills | Status: AC

## 2023-12-20 MED ORDER — ATORVASTATIN CALCIUM 20 MG PO TABS: 20.0000 mg | ORAL_TABLET | Freq: Every day | ORAL | 1 refills | Status: DC

## 2023-12-20 MED ORDER — METOPROLOL SUCCINATE ER 50 MG PO TB24: 100.0000 mg | ORAL_TABLET | Freq: Every evening | ORAL | 1 refills | Status: DC

## 2023-12-20 MED ORDER — AMLODIPINE BESYLATE 5 MG PO TABS: 5.0000 mg | ORAL_TABLET | Freq: Two times a day (BID) | ORAL | 1 refills | Status: AC

## 2023-12-20 NOTE — Assessment & Plan Note (Addendum)
 chronic, BP still a little high Olmesartan -HCTZ 40-25mg , Amlodipine  5mg , Metoprolol  50mg  bid. BP good today Advised to increase the Amlodipine  to twice a day last visit, pt unsure if doing this, instructions on bottle still says qd, sending refill today checking labs today with CPE all refills sent in to new mail order pharmacy f/u 4 mos

## 2023-12-20 NOTE — Assessment & Plan Note (Signed)
 chronic taking Lipitor 20mg  qd, last lipids in 2023 wnl sending refill to new mail order pharmacy f/u 52m-54yr with fasting labs

## 2023-12-20 NOTE — Patient Instructions (Addendum)
 It was very nice to see you today!   I will review your lab results via MyChart in a few days.  I have sent in Clonidine to start taking at bedtime to help with your hot flashes and night sweats. Let me know if not working or if you have any drowsiness or dizziness when you wake up.  Schedule a follow up for 4 months please!      PLEASE NOTE:  If you had any lab tests please let us  know if you have not heard back within a few days. You may see your results on MyChart before we have a chance to review them but we will give you a call once they are reviewed by us . If we ordered any referrals today, please let us  know if you have not heard from their office within the next week.

## 2023-12-20 NOTE — Assessment & Plan Note (Signed)
 Persistent hot flashes and night sweats. Discussed non-hormonal options including clonidine and black cohosh. - Start clonidine 0.1 mg at bedtime. - Consider black cohosh as an herbal supplement during the day. - Monitor for side effects such as drowsiness or dizziness. - Advise on lifestyle modifications: reduce caffeine, dress in layers. - F/U in 1 mo or prn

## 2023-12-20 NOTE — Assessment & Plan Note (Signed)
 Well-managed on current medication. Previous labs showed low levels, T4 normal. Consider pituitary evaluation due to unusual labs and fatigue. - Continue current thyroid  medication. - Check Thyrotropin releasing hormone today with TSH. refill sent in to new mail order pharmacy f/u 4 mos

## 2023-12-20 NOTE — Progress Notes (Signed)
 Phone [phone]  Subjective:   Patient is a 63 y.o. female presenting for annual physical.    Chief Complaint  Patient presents with   Annual Exam    Fasting w/ labs   Discussed the use of AI scribe software for clinical note transcription with the patient, who gave verbal consent to proceed.  History of Present Illness Courtney Collins "Courtney Collins" is a 63 year old female who presents for a yearly physical exam required by her insurance and follow up on her chronic conditions.    She takes metoprolol  and olmesartan /hydrochlorothiazide  for hypertension, a thyroid  medication, and Lipitor for hyperlipidemia. Her thyroid  medication dose remains unchanged despite previous low thyroid  levels, with a normal T4. She experiences persistent fatigue and has not undergone thyroid  surgery. She experiences hot flashes and night sweats since premenopause and has not received hormone therapy post-hysterectomy. She feels hot frequently. She has allergies with recent eye irritation but no significant nasal symptoms. She experiences hip pain when walking, affecting her exercise routine. She maintains regular bowel movements with Vespa pills and adequate hydration. She abstains from alcohol and has never smoked.  Assessment & Plan Wellness Visit Annual exam for insurance. Discussed routine screenings due to postmenopausal status and osteoporosis risk. - Order mammogram and send to breast center for scheduling. - Order DEXA scan to assess osteoporosis risk. - Review lab results on MyChart by next week.  Hypertension Blood pressure managed with metoprolol  and olmesartan /hydrochlorothiazide . Clonidine may assist. - Continue metoprolol . - Continue olmesartan /hydrochlorothiazide . - Send prescriptions to the pharmacy for 90-day supply with refills. - F/U in 1 mo  Hyperlipidemia Cholesterol managed with Lipitor. - Continue Lipitor. - Send prescriptions to the pharmacy for 90-day supply with refills. -  F/U in 1 yr  Hypothyroidism Well-managed on current medication. Previous labs showed low levels, T4 normal. Consider pituitary evaluation due to unusual labs and fatigue. - Continue current thyroid  medication. - Consider further evaluation of pituitary gland if symptoms persist. - F/U 1 yr or sooner based on lab results  Menopausal symptoms Persistent hot flashes and night sweats. Discussed non-hormonal options including clonidine and black cohosh. - Start clonidine 0.1 mg at bedtime. - Consider black cohosh as an herbal supplement during the day. - Monitor for side effects such as drowsiness or dizziness. - Advise on lifestyle modifications: reduce caffeine, dress in layers. - F/U 1 month  Allergic rhinitis No current issues. Previous use of nasal spray for runny nose when sick. - Use nasal spray as needed for runny nose. - Consider Nasacort  or Flonase for eye or throat symptoms if she occurs.    See problem oriented charting- ROS- full  review of systems was completed and negative except for: what is noted in HPI above.  The following were reviewed and entered/updated in epic: Past Medical History:  Diagnosis Date   Abnormal uterine bleeding    Arthritis    Back muscle spasm 01/12/2011   Fibroids    Hypertension    Hypothyroidism    Obesity (BMI 30-39.9)    Rash and nonspecific skin eruption 09/19/2011   Subacromial bursitis 11/25/2010   Better with steroid injection   Patient Active Problem List   Diagnosis Date Noted   S/P hysterectomy 05/23/2018   Edema 12/14/2011   Vertigo, benign paroxysmal 04/25/2011   Hypothyroidism 08/03/2007   Hyperlipidemia 10/12/2006   Morbid obesity (HCC) 10/12/2006   Essential hypertension 10/12/2006   Past Surgical History:  Procedure Laterality Date   CYSTOSCOPY N/A 05/23/2018   Procedure: CYSTOSCOPY;  Surgeon: Arlee Lace, MD;  Location: [facility];  Service: Gynecology;  Laterality: N/A;   NO PAST SURGERIES     ROBOTIC ASSISTED TOTAL  HYSTERECTOMY WITH BILATERAL SALPINGO OOPHERECTOMY Bilateral 05/23/2018   Procedure: XI ROBOTIC ASSISTED TOTAL HYSTERECTOMY WITH BILATERAL SALPINGO OOPHORECTOMY;  Surgeon: Arlee Lace, MD;  Location: [facility];  Service: Gynecology;  Laterality: Bilateral;   SHOULDER OPEN ROTATOR CUFF REPAIR Right 03/09/2016   Procedure: OPEN RIGHT ROTATOR CUFF REPAIR WITH GRAFT AND ANCHORS;  Surgeon: Hazle Lites, MD;  Location: [facility];  Service: Orthopedics;  Laterality: Right;    Family History  Problem Relation Age of Onset   Hypertension Mother    Cancer Maternal Aunt    Cancer Maternal Uncle    Healthy Father     Medications- reviewed and updated Current Outpatient Medications  Medication Sig Dispense Refill   albuterol  (VENTOLIN  HFA) 108 (90 Base) MCG/ACT inhaler Inhale 1-2 puffs into the lungs every 6 (six) hours as needed for wheezing or shortness of breath. 6.7 g 0   cloNIDine (CATAPRES) 0.1 MG tablet Take 1 tablet (0.1 mg total) by mouth at bedtime. For hot flashes/night sweats 90 tablet 1   amLODipine  (NORVASC ) 5 MG tablet Take 1 tablet (5 mg total) by mouth 2 (two) times daily. 180 tablet 1   atorvastatin  (LIPITOR) 20 MG tablet Take 1 tablet (20 mg total) by mouth daily. 90 tablet 1   levothyroxine  (SYNTHROID ) 150 MCG tablet Take 1 tablet (150 mcg total) by mouth daily. 90 tablet 1   metoprolol  succinate (TOPROL -XL) 50 MG 24 hr tablet Take 2 tablets (100 mg total) by mouth every evening. 180 tablet 1   olmesartan -hydrochlorothiazide  (BENICAR  HCT) 40-25 MG tablet Take 1 tablet by mouth in the morning. NEEDS OFFICE VISIT. 30 DAYS SENT 30 tablet 0   No current facility-administered medications for this visit.    Allergies-reviewed and updated No Known Allergies  Social History   Social History Narrative   Not on file    Objective:  BP 137/89 (BP Location: Left Arm, Patient Position: Sitting, Cuff Size: Large)   Pulse 92   Temp (!) 97 F (36.1 C) (Temporal)   Ht 5\' 3"  (1.6 m)   Wt 224  lb (101.6 kg)   LMP 02/15/2018 (Approximate)   SpO2 97%   BMI 39.68 kg/m  Physical Exam Vitals and nursing note reviewed.  Constitutional:      Appearance: Normal appearance. She is obese.  HENT:     Head: Normocephalic.     Right Ear: Tympanic membrane normal.     Left Ear: Tympanic membrane normal.     Nose: Nose normal.     Mouth/Throat:     Mouth: Mucous membranes are moist.  Eyes:     Pupils: Pupils are equal, round, and reactive to light.  Cardiovascular:     Rate and Rhythm: Normal rate and regular rhythm.  Pulmonary:     Effort: Pulmonary effort is normal.     Breath sounds: Normal breath sounds.  Musculoskeletal:        General: Normal range of motion.     Cervical back: Normal range of motion.  Lymphadenopathy:     Cervical: No cervical adenopathy.  Skin:    General: Skin is warm and dry.  Neurological:     Mental Status: She is alert.  Psychiatric:        Mood and Affect: Mood normal.        Behavior: Behavior normal.  Assessment and Plan   Health Maintenance counseling: 1. Anticipatory guidance: Patient counseled regarding regular dental exams q6 months, eye exams,  avoiding smoking and second hand smoke, limiting alcohol to 1 beverage per day, no illicit drugs.   2. Risk factor reduction:  Advised patient of need for regular exercise and diet rich with fruits and vegetables to reduce risk of heart attack and stroke. Exercise- walking.  Wt Readings from Last 3 Encounters:  12/20/23 224 lb (101.6 kg)  06/08/23 229 lb (103.9 kg)  01/05/23 225 lb (102.1 kg)   3. Immunizations/screenings/ancillary studies Immunization History  Administered Date(s) Administered   Influenza,inj,Quad PF,6+ Mos 09/03/2020, 05/05/2021   PFIZER(Purple Top)SARS-COV-2 Vaccination 08/30/2019   Td 03/19/2004   Tdap 08/26/2014   Zoster Recombinant(Shingrix) 04/19/2022, 05/25/2023   Health Maintenance Due  Topic Date Due   HIV Screening  Never done    4. Cervical cancer  screening-  total hysterectomy in 2019 5. Breast cancer screening-  mammogram   6. Colon cancer screening -  Cologuard in 2024 7. Skin cancer screening- advised regular sunscreen use. Denies worrisome, changing, or new skin lesions.  8. Birth control/STD check- none/N/A 9. Osteoporosis screening- ordering today 10. Alcohol screening: none 11. Smoking associated screening (lung cancer screening, AAA screen 65-75, UA)- never smoker  Assessment & Plan Wellness Visit Annual exam for insurance. Discussed routine screenings due to postmenopausal status and osteoporosis risk. - Order mammogram and send to breast center for scheduling. - Order DEXA scan to assess osteoporosis risk. - Review lab results on MyChart by next week.  Hypertension Blood pressure managed with metoprolol  and olmesartan /hydrochlorothiazide . Clonidine may assist. - Continue metoprolol  50mg , Amlodipine  5mg  bid - Continue olmesartan /hydrochlorothiazide  qam - Check CMP today with [facility] labs - Send prescriptions to the new mail order pharmacy for 90-day supply with refills. - F/U in 4mos  Hyperlipidemia Cholesterol managed with Lipitor 20mg  qd. - Continue Lipitor qd - Check lipids today with [facility]PE labs - Send prescriptions to the new mail order pharmacy for 90-day supply with refills. - F/U in 1 year  Hypothyroidism Well-managed on current medication. Previous TSH levels below normal. Consider pituitary evaluation due to below normal TSH, but normal T4 and fatigue. - Check TSH and Thyrotropin releasing hormone today - Continue current thyroid  medication. - Sending refill of Levothyroxine  to new mail order pharmacy. - F/U in 1 year or prn based on lab results  Postmenopausal symptoms - Persistent hot flashes and night sweats. Discussed non-hormonal options including clonidine and black cohosh. - Start clonidine 0.1 mg at bedtime. - Consider black cohosh as an herbal supplement during the day. - Monitor for side  effects such as drowsiness or dizziness. - Advise on lifestyle modifications: reduce caffeine, dress in layers. - F/U in 1 mo or prn   Recommended follow up:  Return in about 4 weeks (around 01/17/2024) for HTN, med refills. Future Appointments  Date Time Provider Department Center  03/20/2024  2:20 PM Wellington Half, FNP [facility] None    Lab/Order associations: fasting   Versa Gore, NP

## 2023-12-21 ENCOUNTER — Ambulatory Visit

## 2023-12-21 ENCOUNTER — Other Ambulatory Visit: Payer: Self-pay | Admitting: Family

## 2023-12-21 DIAGNOSIS — R739 Hyperglycemia, unspecified: Secondary | ICD-10-CM | POA: Diagnosis not present

## 2023-12-21 LAB — HIV ANTIBODY (ROUTINE TESTING W REFLEX): HIV 1&2 Ab, 4th Generation: NONREACTIVE

## 2023-12-21 LAB — HEPATITIS C ANTIBODY: Hepatitis C Ab: NONREACTIVE

## 2023-12-21 LAB — HEMOGLOBIN A1C: Hgb A1c MFr Bld: 7.6 % — ABNORMAL HIGH (ref 4.6–6.5)

## 2023-12-22 LAB — THYROTROPIN RECEPTOR AUTOABS: Thyrotropin Receptor Ab: <1.1 IU/L (ref 0.00–1.75)

## 2023-12-25 ENCOUNTER — Encounter: Payer: Self-pay | Admitting: Family

## 2024-01-01 ENCOUNTER — Telehealth: Payer: Self-pay

## 2024-01-01 NOTE — Telephone Encounter (Signed)
 Copied from CRM [id number]. Topic: Clinical - Prescription Issue >> Jan 01, 2024 12:31 PM Earnestine Goes B wrote: Reason for CRM: [facility], Pattie Borders called for clarity on a prescription that was sent  for amlodipine  5mg s, please call back at [phone]  [phone]   I returned call from [facility], Rx clarified.

## 2024-01-10 ENCOUNTER — Ambulatory Visit
Admission: RE | Admit: 2024-01-10 | Discharge: 2024-01-10 | Disposition: A | Discharge status: Home / Self Care | Source: Ambulatory Visit | Attending: Family | Admitting: Family

## 2024-01-10 ENCOUNTER — Ambulatory Visit

## 2024-01-10 DIAGNOSIS — Z1231 Encounter for screening mammogram for malignant neoplasm of breast: Secondary | ICD-10-CM

## 2024-01-15 NOTE — Progress Notes (Signed)
 Patient ID: Courtney Collins, female    DOB: 1961/04/17, 63 y.o.   MRN: [id number]  Chief Complaint  Patient presents with   Diabetes   Hypertension  Discussed the use of AI scribe software for clinical note transcription with the patient, who gave verbal consent to proceed.  History of Present Illness Courtney Collins "Delle Ferdinand" is a 63 year old female with type 2 diabetes who presents for follow-up on her diabetes management and blood pressure control.  She was recently diagnosed with type 2 diabetes following an elevated A1c. She has lost 6 to 8 pounds on her own by reducing portion sizes and consuming more salads. She manages hypertension with clonidine  at bedtime, which helps with hot flashes and does not cause excessive morning drowsiness. Her home blood pressure readings have been stable, with a recent reading of 123/84 mmHg, although it was elevated this morning. She also takes amlodipine  twice daily, olmesartan  with hydrochlorothiazide , and metoprolol  at night. She experiences fatigue but is not overly drowsy in the morning.  She is focusing on lifestyle changes, including portion control and increased water  intake, to aid in weight loss and improve overall health. She enjoys warmer weather, which allows for more outdoor activity.  Assessment & Plan Type 2 Diabetes Mellitus Newly diagnosed, likely related to weight gain. Emphasized weight loss for glycemic control. Recommended Mounjaro (tirzepatide) for efficacy and weekly dosing. Explained injection process and side effects. Weight loss could reduce blood pressure and antihypertensive needs. - Prescribe Mounjaro 2.5mg  qweek (tirzepatide) and send prescription to [facility]. - Encourage continued  weight loss through diet and exercise. - F/U in 1 month after starting the medication.  Hypertension - Chronic hypertension managed with current regimen. Home readings well-managed, elevated this morning. Clonidine  added for hot  flashes may aid blood pressure control. Discussed potential medication reduction with weight loss. She lost 6-8 pounds through diet and activity. - Continue current antihypertensive regimen: amlodipine , olmesartan  with hydrochlorothiazide , metoprolol , and clonidine . - Encourage continued dietary modifications and increased physical activity. - Monitor blood pressure regularly. - Consider reducing metoprolol  dosage if weight loss is achieved and blood pressure remains well-managed. - F/U 1 month  Hot Flashes - Managed with clonidine  0.1mg   qhs, effective. Reports tiredness but not excessive drowsiness. Taken at bedtime to minimize daytime drowsiness. - Continue clonidine  at current dose. - Monitor for side effects and adjust dosage if necessary. - F/U in 6 mos or prn   Subjective:     Outpatient Medications Prior to Visit  Medication Sig Dispense Refill   albuterol  (VENTOLIN  HFA) 108 (90 Base) MCG/ACT inhaler Inhale 1-2 puffs into the lungs every 6 (six) hours as needed for wheezing or shortness of breath. 6.7 g 0   amLODipine  (NORVASC ) 5 MG tablet Take 1 tablet (5 mg total) by mouth 2 (two) times daily. 180 tablet 1   atorvastatin  (LIPITOR) 20 MG tablet Take 1 tablet (20 mg total) by mouth daily. 90 tablet 1   cloNIDine  (CATAPRES ) 0.1 MG tablet Take 1 tablet (0.1 mg total) by mouth at bedtime. For hot flashes/night sweats 90 tablet 1   levothyroxine  (SYNTHROID ) 150 MCG tablet Take 1 tablet (150 mcg total) by mouth daily. 90 tablet 1   metoprolol  succinate (TOPROL -XL) 50 MG 24 hr tablet Take 2 tablets (100 mg total) by mouth every evening. 180 tablet 1   olmesartan -hydrochlorothiazide  (BENICAR  HCT) 40-25 MG tablet Take 1 tablet by mouth in the morning. NEEDS OFFICE VISIT. 30 DAYS SENT 30 tablet 0  No facility-administered medications prior to visit.   Past Medical History:  Diagnosis Date   Abnormal uterine bleeding    Arthritis    Back muscle spasm 01/12/2011   Edema 12/14/2011    Fibroids    Hypertension    Hypothyroidism    Obesity (BMI 30-39.9)    Rash and nonspecific skin eruption 09/19/2011   Subacromial bursitis 11/25/2010   Better with steroid injection   Vertigo, benign paroxysmal 04/25/2011   Past Surgical History:  Procedure Laterality Date   CYSTOSCOPY N/A 05/23/2018   Procedure: CYSTOSCOPY;  Surgeon: Arlee Lace, MD;  Location: [facility];  Service: Gynecology;  Laterality: N/A;   NO PAST SURGERIES     ROBOTIC ASSISTED TOTAL HYSTERECTOMY WITH BILATERAL SALPINGO OOPHERECTOMY Bilateral 05/23/2018   Procedure: XI ROBOTIC ASSISTED TOTAL HYSTERECTOMY WITH BILATERAL SALPINGO OOPHORECTOMY;  Surgeon: Arlee Lace, MD;  Location: [facility];  Service: Gynecology;  Laterality: Bilateral;   SHOULDER OPEN ROTATOR CUFF REPAIR Right 03/09/2016   Procedure: OPEN RIGHT ROTATOR CUFF REPAIR WITH GRAFT AND ANCHORS;  Surgeon: Hazle Lites, MD;  Location: [facility];  Service: Orthopedics;  Laterality: Right;   No Known Allergies    Objective:    Physical Exam Vitals and nursing note reviewed.  Constitutional:      Appearance: Normal appearance. She is obese.  Cardiovascular:     Rate and Rhythm: Normal rate and regular rhythm.  Pulmonary:     Effort: Pulmonary effort is normal.     Breath sounds: Normal breath sounds.  Musculoskeletal:        General: Normal range of motion.  Skin:    General: Skin is warm and dry.  Neurological:     Mental Status: She is alert.  Psychiatric:        Mood and Affect: Mood normal.        Behavior: Behavior normal.    BP (!) 154/89 (BP Location: Left Arm, Patient Position: Sitting, Cuff Size: Large)   Pulse 74   Temp (!) 97 F (36.1 C) (Temporal)   Ht 5\' 3"  (1.6 m)   Wt 216 lb (98 kg)   LMP 02/15/2018 (Approximate)   SpO2 98%   BMI 38.26 kg/m  Wt Readings from Last 3 Encounters:  01/16/24 216 lb (98 kg)  12/20/23 224 lb (101.6 kg)  06/08/23 229 lb (103.9 kg)      Versa Gore, NP

## 2024-01-16 ENCOUNTER — Encounter: Payer: Self-pay | Admitting: Family

## 2024-01-16 ENCOUNTER — Other Ambulatory Visit: Payer: Self-pay

## 2024-01-16 ENCOUNTER — Ambulatory Visit: Admitting: Family

## 2024-01-16 DIAGNOSIS — E1169 Type 2 diabetes mellitus with other specified complication: Secondary | ICD-10-CM | POA: Insufficient documentation

## 2024-01-16 DIAGNOSIS — Z7985 Long-term (current) use of injectable non-insulin antidiabetic drugs: Secondary | ICD-10-CM | POA: Diagnosis not present

## 2024-01-16 DIAGNOSIS — I10 Essential (primary) hypertension: Secondary | ICD-10-CM

## 2024-01-16 DIAGNOSIS — N951 Menopausal and female climacteric states: Secondary | ICD-10-CM | POA: Diagnosis not present

## 2024-01-16 DIAGNOSIS — Z6838 Body mass index (BMI) 38.0-38.9, adult: Secondary | ICD-10-CM

## 2024-01-16 MED ORDER — OLMESARTAN MEDOXOMIL-HCTZ 40-25 MG PO TABS
1.0000 | ORAL_TABLET | Freq: Every morning | ORAL | 0 refills | Status: DC
Start: 2024-01-16 — End: 2024-04-16

## 2024-01-16 MED ORDER — TIRZEPATIDE 2.5 MG/0.5ML ~~LOC~~ SOAJ
2.5000 mg | SUBCUTANEOUS | 1 refills | Status: AC
Start: 2024-01-16 — End: ?

## 2024-01-16 NOTE — Assessment & Plan Note (Signed)
 Newly diagnosed, likely related to weight gain. Emphasized weight loss for glycemic control. Recommended Mounjaro (tirzepatide) for efficacy and weekly dosing. Explained injection process and side effects. Weight loss could reduce blood pressure and antihypertensive needs. - Prescribe Mounjaro 2.5mg  qweek (tirzepatide) and send prescription to [facility]. - Encourage continued  weight loss through diet and exercise. - F/U in 1 month after starting the medication.

## 2024-01-16 NOTE — Assessment & Plan Note (Signed)
 Managed with clonidine  0.1mg   qhs, effective. Reports tiredness but not excessive drowsiness. Taken at bedtime to minimize daytime drowsiness. - Continue clonidine  at current dose. - Monitor for side effects and adjust dosage if necessary. - F/U in 6 mos or prn

## 2024-01-16 NOTE — Assessment & Plan Note (Signed)
 Chronic hypertension managed with current regimen. Home readings well-managed, elevated this morning. Clonidine  added for hot flashes may aid blood pressure control. Discussed potential medication reduction with weight loss. She lost 6-8 pounds through diet and activity. - Continue current antihypertensive regimen: amlodipine , olmesartan  with hydrochlorothiazide , metoprolol , and clonidine . - Encourage continued dietary modifications and increased physical activity. - Monitor blood pressure regularly. - Consider reducing metoprolol  dosage if weight loss is achieved and blood pressure remains well-managed. - F/U 1 month

## 2024-01-25 ENCOUNTER — Other Ambulatory Visit: Payer: Self-pay | Admitting: Family

## 2024-01-25 NOTE — Telephone Encounter (Signed)
 Copied from CRM [id number]. Topic: Clinical - Prescription Issue >> Jan 25, 2024 10:45 AM Kita Perish H wrote: Reason for CRM: Patient states refill for the tirzepatide  (MOUNJARO ) 2.5 MG/0.5ML Pen was sent to the mail order pharmacy but they won't fill because it's only a 1 month supply sent in and needs to be a 3 month supply for them to fill. Patient would like for the medication to be sent to the [facility] on file since [facility] won't fill the 1 month.  Carri [phone]

## 2024-01-26 MED ORDER — TIRZEPATIDE 2.5 MG/0.5ML ~~LOC~~ SOAJ: 2.5000 mg | SUBCUTANEOUS | 1 refills | Status: DC

## 2024-01-29 ENCOUNTER — Other Ambulatory Visit (HOSPITAL_COMMUNITY): Payer: Self-pay

## 2024-01-29 ENCOUNTER — Telehealth: Payer: Self-pay

## 2024-01-29 NOTE — Telephone Encounter (Signed)
 Pharmacy Patient Advocate Encounter   Received notification from Onbase that prior authorization for Mounjaro  2.5MG /0.5ML auto-injectors is required/requested.   Insurance verification completed.   The patient is insured through [employer] .   Per test claim: PA required; PA submitted to above mentioned insurance via CoverMyMeds Key/confirmation #/EOC  [id number] Status is pending

## 2024-01-31 NOTE — Telephone Encounter (Signed)
 Pharmacy Patient Advocate Encounter  Received notification from [employer] that Prior Authorization for MOUNJARO  2.5MG /0.5ML has been APPROVED from 01/29/24 to 01/29/25   PA #/Case ID/Reference #: [id number]

## 2024-02-13 ENCOUNTER — Ambulatory Visit: Admitting: Family

## 2024-02-13 ENCOUNTER — Encounter: Payer: Self-pay | Admitting: Family

## 2024-02-13 DIAGNOSIS — E039 Hypothyroidism, unspecified: Secondary | ICD-10-CM

## 2024-02-13 DIAGNOSIS — E1169 Type 2 diabetes mellitus with other specified complication: Secondary | ICD-10-CM | POA: Diagnosis not present

## 2024-02-13 DIAGNOSIS — I10 Essential (primary) hypertension: Secondary | ICD-10-CM

## 2024-02-13 DIAGNOSIS — R7989 Other specified abnormal findings of blood chemistry: Secondary | ICD-10-CM

## 2024-02-13 DIAGNOSIS — N951 Menopausal and female climacteric states: Secondary | ICD-10-CM

## 2024-02-13 DIAGNOSIS — Z7985 Long-term (current) use of injectable non-insulin antidiabetic drugs: Secondary | ICD-10-CM

## 2024-02-13 LAB — CBC WITH DIFFERENTIAL/PLATELET
Basophils Absolute: 0.1 10*3/uL (ref 0.0–0.1)
Basophils Relative: 0.7 % (ref 0.0–3.0)
Eosinophils Absolute: 0.3 10*3/uL (ref 0.0–0.7)
Eosinophils Relative: 3.1 % (ref 0.0–5.0)
HCT: 40 % (ref 36.0–46.0)
Hemoglobin: 13.4 g/dL (ref 12.0–15.0)
Lymphocytes Relative: 12 % (ref 12.0–46.0)
Lymphs Abs: 1.2 10*3/uL (ref 0.7–4.0)
MCHC: 33.5 g/dL (ref 30.0–36.0)
MCV: 86.1 fl (ref 78.0–100.0)
Monocytes Absolute: 0.6 10*3/uL (ref 0.1–1.0)
Monocytes Relative: 5.9 % (ref 3.0–12.0)
Neutro Abs: 7.9 10*3/uL — ABNORMAL HIGH (ref 1.4–7.7)
Neutrophils Relative %: 78.3 % — ABNORMAL HIGH (ref 43.0–77.0)
Platelets: 342 10*3/uL (ref 150.0–400.0)
RBC: 4.64 Mil/uL (ref 3.87–5.11)
RDW: 13.6 % (ref 11.5–15.5)
WBC: 10.2 10*3/uL (ref 4.0–10.5)

## 2024-02-13 LAB — T3, FREE: T3, Free: 3.4 pg/mL (ref 2.3–4.2)

## 2024-02-13 LAB — MICROALBUMIN / CREATININE URINE RATIO
Creatinine,U: 127.1 mg/dL
Microalb Creat Ratio: 23.8 mg/g (ref 0.0–30.0)
Microalb, Ur: 3 mg/dL — ABNORMAL HIGH (ref 0.0–1.9)

## 2024-02-13 NOTE — Patient Instructions (Signed)
 It was very nice to see you today!   Call the Ret[facility] Screening office to get scheduled - [phone] - ask for [facility].   I will review your lab results via MyChart in a few days.  Schedule follow up in 2 months.    PLEASE NOTE:  If you had any lab tests please let us  know if you have not heard back within a few days. You may see your results on MyChart before we have a chance to review them but we will give you a call once they are reviewed by us . If we ordered any referrals today, please let us  know if you have not heard from their office within the next week.

## 2024-02-13 NOTE — Progress Notes (Signed)
 Patient ID: Courtney Collins, female    DOB: [birth year], 63 y.o.   MRN: [id number]  Chief Complaint  Patient presents with   Diabetes   Hypertension   Obesity  Discussed the use of AI scribe software for clinical note transcription with the patient, who gave verbal consent to proceed.  History of Present Illness Courtney Collins is a 63 year old female with diabetes who presents for a follow-up visit.  Glycemic control and appetite changes - Diabetes mellitus managed with Mounjaro , started June 21st - Significant decrease in appetite since initiation of Mounjaro  - Five-pound weight loss since last visit  Gastrointestinal symptoms - No nausea, heartburn, or constipation since starting Mounjaro  - Constipation managed with Senna a couple of times per week  Cardiovascular status - Blood pressure a little elevated today but stable at home with recent readings of 127/78 mmHg and 123 mmHg systolic  Hematologic findings - Slightly elevated platelets & other blood count values on recent blood work - History of elevated white blood cell count and hemoglobin  General and constitutional symptoms - No hot flashes, night sweats, or dizziness, stopped taking Clonidine  qhs since no sx - No recent sinus infections or feeling unwell  Genitourinary symptoms - No urinary symptoms  Assessment & Plan Type 2 Diabetes Mellitus Recently diagnosed. Started on Mounjaro  2.5mg  qweek (tirzepatide ) with decreased appetite. Goal: manage blood glucose and assist weight loss. Discussed gradual weight loss and monitoring side effects. - Continue Mounjaro  (tirzepatide ) at current dose for two months. - Monitor weight and report any lack of weight loss. - Check urine for protein today to assess kidney health. - Schedule diabetic retinal screening, phone number provided. - F/U in 2 mos  Hypertension Blood pressure well-controlled with recent readings of 127/78 mmHg and 123/78 mmHg. - Continue  taking Amlodipine  5mg  bid, and Olmesartan -HCTZ 40-25mg  qam - Monitor blood pressure regularly. - F/U in 2 mos  Menopausal symptoms Symptoms of hot flashes and night sweats well-controlled with clonidine . However, no recent use of med required. Denies side effects. Clonidine  as needed if symptoms return. - Continue clonidine  as needed for menopausal symptoms.  Chronic Constipation Chronic constipation, possibly exacerbated by Mounjaro . Discussed Miralax as a safer alternative to Senna. Emphasized hydration and regular bowel movements. - Ensure adequate hydration, at least 2 liters daily - Use stool softeners daily if needed. - Limit Senna use to twice a week.  Abnormal blood counts Previous labs showed elevated WBC, hemoglobin, and neutrophils. No current symptoms. Plan to recheck labs and consider hematology referral if abnormalities persist. - Recheck complete blood count (CBC). - Consider referral to hematology if abnormalities persist.   Subjective:    Outpatient Medications Prior to Visit  Medication Sig Dispense Refill   amLODipine  (NORVASC ) 5 MG tablet Take 1 tablet (5 mg total) by mouth 2 (two) times daily. 180 tablet 1   atorvastatin  (LIPITOR) 20 MG tablet Take 1 tablet (20 mg total) by mouth daily. 90 tablet 1   cloNIDine  (CATAPRES ) 0.1 MG tablet Take 1 tablet (0.1 mg total) by mouth at bedtime. For hot flashes/night sweats 90 tablet 1   levothyroxine  (SYNTHROID ) 150 MCG tablet Take 1 tablet (150 mcg total) by mouth daily. 90 tablet 1   metoprolol  succinate (TOPROL -XL) 50 MG 24 hr tablet Take 2 tablets (100 mg total) by mouth every evening. 180 tablet 1   olmesartan -hydrochlorothiazide  (BENICAR  HCT) 40-25 MG tablet Take 1 tablet by mouth in the morning. NEEDS OFFICE VISIT. 30 DAYS SENT 90  tablet 0   tirzepatide  (MOUNJARO ) 2.5 MG/0.5ML Pen Inject 2.5 mg into the skin once a week. 2 mL 1   albuterol  (VENTOLIN  HFA) 108 (90 Base) MCG/ACT inhaler Inhale 1-2 puffs into the lungs  every 6 (six) hours as needed for wheezing or shortness of breath. 6.7 g 0   No facility-administered medications prior to visit.   Past Medical History:  Diagnosis Date   Abnormal uterine bleeding    Arthritis    Back muscle spasm 01/12/2011   Edema 12/14/2011   Fibroids    Hypertension    Hypothyroidism    Obesity (BMI 30-39.9)    Rash and nonspecific skin eruption 09/19/2011   Subacromial bursitis 11/25/2010   Better with steroid injection   Vertigo, benign paroxysmal 04/25/2011   Past Surgical History:  Procedure Laterality Date   CYSTOSCOPY N/A 05/23/2018   Procedure: CYSTOSCOPY;  Surgeon: Rosalva Sawyer, MD;  Location: [facility];  Service: Gynecology;  Laterality: N/A;   NO PAST SURGERIES     ROBOTIC ASSISTED TOTAL HYSTERECTOMY WITH BILATERAL SALPINGO OOPHERECTOMY Bilateral 05/23/2018   Procedure: XI ROBOTIC ASSISTED TOTAL HYSTERECTOMY WITH BILATERAL SALPINGO OOPHORECTOMY;  Surgeon: Rosalva Sawyer, MD;  Location: [facility];  Service: Gynecology;  Laterality: Bilateral;   SHOULDER OPEN ROTATOR CUFF REPAIR Right 03/09/2016   Procedure: OPEN RIGHT ROTATOR CUFF REPAIR WITH GRAFT AND ANCHORS;  Surgeon: Tanda Heading, MD;  Location: [facility];  Service: Orthopedics;  Laterality: Right;   No Known Allergies    Objective:    Physical Exam Vitals and nursing note reviewed.  Constitutional:      Appearance: Normal appearance.   Cardiovascular:     Rate and Rhythm: Normal rate and regular rhythm.  Pulmonary:     Effort: Pulmonary effort is normal.     Breath sounds: Normal breath sounds.   Musculoskeletal:        General: Normal range of motion.   Skin:    General: Skin is warm and dry.   Neurological:     Mental Status: She is alert.   Psychiatric:        Mood and Affect: Mood normal.        Behavior: Behavior normal.    BP (!) 145/90   Pulse 70   Wt 211 lb 12.8 oz (96.1 kg)   LMP 02/15/2018 (Approximate)   SpO2 97%   BMI 37.52 kg/m  Wt Readings from Last 3 Encounters:   02/13/24 211 lb 12.8 oz (96.1 kg)  01/16/24 216 lb (98 kg)  12/20/23 224 lb (101.6 kg)      Lucius Krabbe, NP

## 2024-02-13 NOTE — Assessment & Plan Note (Signed)
 Blood pressure well-controlled with recent readings of 127/78 mmHg and 123/78 mmHg. - Continue taking Amlodipine  5mg  bid, and Olmesartan -HCTZ 40-25mg  qam - Monitor blood pressure regularly. - F/U in 2 mos

## 2024-02-13 NOTE — Assessment & Plan Note (Signed)
 Symptoms of hot flashes and night sweats well-controlled with clonidine . However, no recent use of med required. Denies side effects. Clonidine  as needed if symptoms return. - Continue clonidine  as needed for menopausal symptoms.

## 2024-02-13 NOTE — Assessment & Plan Note (Signed)
 Recently diagnosed. Started on Mounjaro  2.5mg  qweek (tirzepatide ) with decreased appetite. Goal: manage blood glucose and assist weight loss. Discussed gradual weight loss and monitoring side effects. - Continue Mounjaro  (tirzepatide ) at current dose for two months. - Monitor weight and report any lack of weight loss. - Check urine for protein today to assess kidney health. - Schedule diabetic retinal screening, phone number provided. - F/U in 2 mos

## 2024-02-15 LAB — THYROID PEROXIDASE ANTIBODY: Thyroperoxidase Ab SerPl-aCnc: <1 [IU]/mL (ref <9)

## 2024-02-19 ENCOUNTER — Ambulatory Visit: Payer: Self-pay | Admitting: Family

## 2024-02-19 DIAGNOSIS — E039 Hypothyroidism, unspecified: Secondary | ICD-10-CM

## 2024-02-20 MED ORDER — LEVOTHYROXINE SODIUM 137 MCG PO TABS: 150.0000 ug | ORAL_TABLET | Freq: Every day | ORAL | 2 refills | Status: DC

## 2024-02-20 MED ORDER — EMPAGLIFLOZIN 10 MG PO TABS
10.0000 mg | ORAL_TABLET | Freq: Every day | ORAL | 2 refills | Status: DC
Start: 2024-02-20 — End: 2024-04-16

## 2024-03-04 ENCOUNTER — Telehealth: Payer: Self-pay

## 2024-03-04 NOTE — Telephone Encounter (Signed)
 Copied from CRM [id number]. Topic: Clinical - Medication Question >> Mar 04, 2024 11:17 AM Jasmin G wrote: Reason for CRM: Pt was by her insurance that her tirzepatide  (MOUNJARO ) 2.5 MG/0.5ML Pen medication needs to be increased to 5.0 MG in order to be covered,s o she wants to know what would be the plan of action, Please call pt back if needed.   Ok to send RX ?

## 2024-03-05 ENCOUNTER — Encounter: Payer: Self-pay | Admitting: Family Medicine

## 2024-03-05 NOTE — Telephone Encounter (Signed)
yes see other message.

## 2024-03-05 NOTE — Telephone Encounter (Signed)
 ok to send 5mg  with 1 refill - remind her to have f/u visit for 2 months - also remind her of possible side effects, nausea (eat mini meals, don't over eat 1 big meal), heartburn or constipation - has to drink water  in sips all day long = 2.5 liters. Thx

## 2024-03-06 ENCOUNTER — Other Ambulatory Visit (HOSPITAL_COMMUNITY): Payer: Self-pay

## 2024-03-06 ENCOUNTER — Other Ambulatory Visit: Payer: Self-pay

## 2024-03-06 ENCOUNTER — Telehealth: Payer: Self-pay

## 2024-03-06 MED ORDER — TIRZEPATIDE 5 MG/0.5ML ~~LOC~~ SOAJ: 5.0000 mg | SUBCUTANEOUS | 0 refills | Status: DC

## 2024-03-06 MED ORDER — TIRZEPATIDE 5 MG/0.5ML ~~LOC~~ SOAJ: 5.0000 mg | SUBCUTANEOUS | 1 refills | Status: DC

## 2024-03-06 NOTE — Telephone Encounter (Signed)
 Rx sent, MyChart message sent to patient

## 2024-03-06 NOTE — Telephone Encounter (Signed)
 Pharmacy Patient Advocate Encounter   Received notification from Onbase that prior authorization for Mounjaro  5MG /0.5ML is required/requested.   Insurance verification completed.   The patient is insured through [employer] .   [employer], Insurance updated previous PA approval of 2.5MG  to the 5MG . No PA needed at this time.

## 2024-03-06 NOTE — Addendum Note (Signed)
 Addended by: NEYSA CLARIA SAILOR on: 03/06/2024 12:28 PM   Modules accepted: Orders

## 2024-03-06 NOTE — Telephone Encounter (Signed)
 FYI

## 2024-03-08 NOTE — Telephone Encounter (Signed)
 MyChart message sent to pt

## 2024-03-20 ENCOUNTER — Ambulatory Visit: Admitting: Family Medicine

## 2024-04-10 ENCOUNTER — Other Ambulatory Visit: Payer: Self-pay | Admitting: Medical Genetics

## 2024-04-16 ENCOUNTER — Encounter: Payer: Self-pay | Admitting: Family

## 2024-04-16 ENCOUNTER — Ambulatory Visit: Admitting: Family

## 2024-04-16 VITALS — BP 152/80 | HR 61 | Temp 97.3°F | Ht 63.0 in | Wt 197.6 lb

## 2024-04-16 DIAGNOSIS — I10 Essential (primary) hypertension: Secondary | ICD-10-CM

## 2024-04-16 DIAGNOSIS — E039 Hypothyroidism, unspecified: Secondary | ICD-10-CM

## 2024-04-16 DIAGNOSIS — Z7984 Long term (current) use of oral hypoglycemic drugs: Secondary | ICD-10-CM

## 2024-04-16 DIAGNOSIS — E1169 Type 2 diabetes mellitus with other specified complication: Secondary | ICD-10-CM

## 2024-04-16 LAB — T4, FREE: Free T4: 1.08 ng/dL (ref 0.60–1.60)

## 2024-04-16 LAB — TSH: TSH: 0.03 u[IU]/mL — ABNORMAL LOW (ref 0.35–5.50)

## 2024-04-16 MED ORDER — EMPAGLIFLOZIN 10 MG PO TABS
10.0000 mg | ORAL_TABLET | Freq: Every day | ORAL | 1 refills | Status: AC
Start: 2024-04-16 — End: ?

## 2024-04-16 MED ORDER — OLMESARTAN MEDOXOMIL-HCTZ 40-25 MG PO TABS
1.0000 | ORAL_TABLET | Freq: Every morning | ORAL | 1 refills | Status: AC
Start: 2024-04-16 — End: ?

## 2024-04-16 MED ORDER — TIRZEPATIDE 5 MG/0.5ML ~~LOC~~ SOAJ: 5.0000 mg | SUBCUTANEOUS | 0 refills | Status: DC

## 2024-04-16 NOTE — Assessment & Plan Note (Addendum)
 Blood pressure elevated despite weight loss. No Dizziness reported, no headaches. Clinic spikes, controlled at home. Also taking Clonidine  0.1mg  qhs for hot flashes. Taking olmesartan /hydrochlorothiazide  40-25mg  qam  and amlodipine  5mg  bid. - Refilling olmesartan /hydrochlorothiazide . - Continued amlodipine  twice daily & Clonidine  qhs. - Recheck blood pressure after deep breathing exercises. - F/U in 3 mos

## 2024-04-16 NOTE — Progress Notes (Addendum)
 Patient ID: Courtney Collins, female    DOB: 03-31-1961, 63 y.o.   MRN: [id number]  Chief Complaint  Patient presents with   Diabetes   Hypertension   Hypothyroidism  Discussed the use of AI scribe software for clinical note transcription with the patient, who gave verbal consent to proceed.  History of Present Illness   Courtney Collins is a 63 year old female with hypertension and diabetes who presents for blood pressure management and medication review.  Hypertension and blood pressure control - Blood pressure remains elevated despite recent weight loss. - Takes olmesartan  and hydrochlorothiazide  in the morning, and amlodipine  twice daily. - Home blood pressure readings are suboptimal. - Occasional dizziness present. - No headaches.  Diabetes mellitus and renal protection - Takes Jardiance  for kidney protection due to diabetes and hypertension. - Requires refill of Jardiance . - No neuropathy symptoms such as tingling or numbness in her feet.  Thyroid  function and medication adjustment - Thyroid  medication dose adjusted in May from 150 mcg to 137 mcg. - No significant change in symptoms after dose adjustment.  Appetite and weight management - Recently started Mounjaro  at 5 mg, with two doses taken. - Increased hunger at 5 mg dose, manages diet and portion sizes. - At previous 2.5 mg dose, experienced increased satiety. - Active lifestyle with walking her dog 1-2 times per day. - Manages weight through portion control and physical activity.     Assessment & Plan:     Essential hypertension Blood pressure elevated despite weight loss. No Dizziness reported, no headaches. Clinic spikes, controlled at home. On olmesartan /hydrochlorothiazide  40-25mg  qam  and amlodipine  5mg  bid. Also taking Clonidine  0.1mg  qhs for hot flashes. - Refilling olmesartan /hydrochlorothiazide . - Continued amlodipine  twice daily and Clonipine 0.1mg  qhs. - Recheck blood pressure after deep  breathing exercises. - F/U in 3 mos  Type 2 diabetes mellitus with obesity Diabetes managed with Jardiance , beneficial for kidneys. No neuropathy. Weight loss ongoing, managing hunger on Mounjaro . No heartburn or constipation. Discussed portion control, physical activity, hydration. - Refilled Jardiance . - Continued Mounjaro . - Encouraged portion control and regular physical activity, including walking and biking. - Advised managing hunger with small meals and adequate hydration. - F/U in 3 mos  Hypothyroidism Thyroid  medication adjusted in May, no significant symptom change. Current dose 130 mcg, reduced from 150 mcg. No symptom worsening. - Ordered TSH and free T4 tests to evaluate thyroid  function. - F/U in 3-21mos     Subjective:    Outpatient Medications Prior to Visit  Medication Sig Dispense Refill   amLODipine  (NORVASC ) 5 MG tablet Take 1 tablet (5 mg total) by mouth 2 (two) times daily. 180 tablet 1   atorvastatin  (LIPITOR) 20 MG tablet Take 1 tablet (20 mg total) by mouth daily. 90 tablet 1   empagliflozin  (JARDIANCE ) 10 MG TABS tablet Take 1 tablet (10 mg total) by mouth daily. 30 tablet 2   levothyroxine  (SYNTHROID ) 137 MCG tablet Take 1 tablet (137 mcg total) by mouth daily. 30 tablet 2   metoprolol  succinate (TOPROL -XL) 50 MG 24 hr tablet Take 2 tablets (100 mg total) by mouth every evening. 180 tablet 1   olmesartan -hydrochlorothiazide  (BENICAR  HCT) 40-25 MG tablet Take 1 tablet by mouth in the morning. NEEDS OFFICE VISIT. 30 DAYS SENT 90 tablet 0   tirzepatide  (MOUNJARO ) 5 MG/0.5ML Pen Inject 5 mg into the skin once a week. 6 mL 1   cloNIDine  (CATAPRES ) 0.1 MG tablet Take 1 tablet (0.1 mg total) by mouth  at bedtime. For hot flashes/night sweats 90 tablet 1   tirzepatide  (MOUNJARO ) 2.5 MG/0.5ML Pen Inject 2.5 mg into the skin once a week. 2 mL 1   No facility-administered medications prior to visit.   Past Medical History:  Diagnosis Date   Abnormal uterine bleeding     Arthritis    Back muscle spasm 01/12/2011   Edema 12/14/2011   Fibroids    Hypertension    Hypothyroidism    Obesity (BMI 30-39.9)    Rash and nonspecific skin eruption 09/19/2011   Subacromial bursitis 11/25/2010   Better with steroid injection   Vertigo, benign paroxysmal 04/25/2011   Past Surgical History:  Procedure Laterality Date   CYSTOSCOPY N/A 05/23/2018   Procedure: CYSTOSCOPY;  Surgeon: Rosalva Sawyer, MD;  Location: [facility];  Service: Gynecology;  Laterality: N/A;   NO PAST SURGERIES     ROBOTIC ASSISTED TOTAL HYSTERECTOMY WITH BILATERAL SALPINGO OOPHERECTOMY Bilateral 05/23/2018   Procedure: XI ROBOTIC ASSISTED TOTAL HYSTERECTOMY WITH BILATERAL SALPINGO OOPHORECTOMY;  Surgeon: Rosalva Sawyer, MD;  Location: [facility];  Service: Gynecology;  Laterality: Bilateral;   SHOULDER OPEN ROTATOR CUFF REPAIR Right 03/09/2016   Procedure: OPEN RIGHT ROTATOR CUFF REPAIR WITH GRAFT AND ANCHORS;  Surgeon: Tanda Heading, MD;  Location: [facility];  Service: Orthopedics;  Laterality: Right;   No Known Allergies    Objective:    Physical Exam Vitals and nursing note reviewed.  Constitutional:      Appearance: Normal appearance. She is obese.  Cardiovascular:     Rate and Rhythm: Normal rate and regular rhythm.  Pulmonary:     Effort: Pulmonary effort is normal.     Breath sounds: Normal breath sounds.  Musculoskeletal:        General: Normal range of motion.  Feet:     Right foot:     Protective Sensation: 10 sites tested.  10 sites sensed.     Skin integrity: Callus present.     Left foot:     Protective Sensation: 10 sites tested.  10 sites sensed.     Skin integrity: Callus present.  Skin:    General: Skin is warm and dry.  Neurological:     Mental Status: She is alert.  Psychiatric:        Mood and Affect: Mood normal.        Behavior: Behavior normal.    BP (!) 166/110 (BP Location: Left Arm, Patient Position: Sitting, Cuff Size: Large)   Pulse 61   Temp (!) 97.3 F (36.3 C)  (Temporal)   Ht 5' 3 (1.6 m)   Wt 197 lb 9.6 oz (89.6 kg)   LMP 02/15/2018 (Approximate)   SpO2 100%   BMI 35.00 kg/m  Wt Readings from Last 3 Encounters:  04/16/24 197 lb 9.6 oz (89.6 kg)  02/13/24 211 lb 12.8 oz (96.1 kg)  01/16/24 216 lb (98 kg)      Lucius Krabbe, NP

## 2024-04-16 NOTE — Assessment & Plan Note (Signed)
 Diabetes managed with Jardiance , beneficial for kidneys. No neuropathy. Weight loss ongoing, managing hunger on Mounjaro . No heartburn or constipation. Discussed portion control, physical activity, hydration. - Refilled Jardiance . - Continued Mounjaro . - Encouraged portion control and regular physical activity, including walking and biking. - Advised managing hunger with small meals and adequate hydration. - F/U in 3 mos

## 2024-04-16 NOTE — Assessment & Plan Note (Signed)
 Thyroid  medication adjusted in May, no significant symptom change. Current dose 130 mcg, reduced from 150 mcg. No symptom worsening. - Ordered TSH and free T4 tests to evaluate thyroid  function. - F/U in 3-5mos

## 2024-04-17 ENCOUNTER — Ambulatory Visit: Payer: Self-pay | Admitting: Family

## 2024-04-17 DIAGNOSIS — E039 Hypothyroidism, unspecified: Secondary | ICD-10-CM

## 2024-04-17 MED ORDER — LEVOTHYROXINE SODIUM 125 MCG PO TABS: 125.0000 ug | ORAL_TABLET | Freq: Every day | ORAL | 2 refills | Status: DC

## 2024-04-23 ENCOUNTER — Other Ambulatory Visit: Payer: Self-pay

## 2024-04-30 ENCOUNTER — Other Ambulatory Visit

## 2024-04-30 DIAGNOSIS — Z006 Encounter for examination for normal comparison and control in clinical research program: Secondary | ICD-10-CM

## 2024-05-10 LAB — GENECONNECT MOLECULAR SCREEN: Genetic Analysis Overall Interpretation: NEGATIVE

## 2024-06-12 ENCOUNTER — Other Ambulatory Visit: Payer: Self-pay | Admitting: Family

## 2024-06-12 DIAGNOSIS — I10 Essential (primary) hypertension: Secondary | ICD-10-CM

## 2024-06-29 ENCOUNTER — Other Ambulatory Visit: Payer: Self-pay | Admitting: Family

## 2024-06-29 DIAGNOSIS — E782 Mixed hyperlipidemia: Secondary | ICD-10-CM

## 2024-07-16 ENCOUNTER — Encounter: Payer: Self-pay | Admitting: Family

## 2024-07-16 ENCOUNTER — Ambulatory Visit: Payer: Medicare (Managed Care) | Admitting: Family

## 2024-07-16 VITALS — BP 130/82 | HR 81 | Temp 97.2°F | Ht 63.0 in | Wt 183.1 lb

## 2024-07-16 DIAGNOSIS — E039 Hypothyroidism, unspecified: Secondary | ICD-10-CM

## 2024-07-16 DIAGNOSIS — E1169 Type 2 diabetes mellitus with other specified complication: Secondary | ICD-10-CM

## 2024-07-16 DIAGNOSIS — Z23 Encounter for immunization: Secondary | ICD-10-CM

## 2024-07-16 DIAGNOSIS — Z13820 Encounter for screening for osteoporosis: Secondary | ICD-10-CM

## 2024-07-16 LAB — TSH: TSH: 0.01 u[IU]/mL — ABNORMAL LOW (ref 0.35–5.50)

## 2024-07-16 LAB — HEMOGLOBIN A1C: Hgb A1c MFr Bld: 6.1 % (ref 4.6–6.5)

## 2024-07-16 LAB — T4, FREE: Free T4: 1.28 ng/dL (ref 0.60–1.60)

## 2024-07-16 MED ORDER — TIRZEPATIDE 7.5 MG/0.5ML ~~LOC~~ SOAJ: 7.5000 mg | SUBCUTANEOUS | 0 refills | Status: AC

## 2024-07-16 NOTE — Progress Notes (Unsigned)
 Patient ID: Courtney Collins, female    DOB: 07-Dec-1960, 63 y.o.   MRN: [id number]  Chief Complaint  Patient presents with   Acquired hypothyroidism    Follow up   Type 2 diabetes mellitus with morbid obesity   Essential hypertension  Discussed the use of AI scribe software for clinical note transcription with the patient, who gave verbal consent to proceed.  History of Present Illness Courtney Collins is a 63 year old female with diabetes who presents for medication management and follow-up.  She notes her weight loss has slowed, with two weeks of no loss followed by a one-pound decrease. She takes Mounjaro  5 mg as prescribed without nausea.  She takes Jardiance  10 mg for diabetes but splits the tablets to lower her cost. She recently [place] in [place] but still pays about $185 monthly for medications and is concerned about affordability.  She is up to date on flu and pneumonia vaccines. She has never had a bone density scan and would like to schedule one. She stopped going to the gym but plans to resume with a friend.  She limits sweets and bread and prefers vegetables and plain sweet potatoes. She is trying to maintain healthy eating during the holidays to support weight and diabetes control.  Assessment & Plan Type 2 diabetes mellitus with morbid obesity Weight loss plateau with Mounjaro  5 mg. Discussed increasing to 7.5 mg to enhance weight loss, with caution for side effects. Addressed cost concerns with Jardiance  and Medicaid coverage. - Ordered labs for A1c and thyroid  function. - Prescribed Mounjaro  7.5 mg, option to revert to 5 mg if side effects occur. - Prescribed Jardiance , option for half tablet daily if cost is a concern. - Checked for medication samples. - Encouraged weight lifting to maintain muscle mass.  Acquired hypothyroidism Stable on current medication regimen. - Ordered labs to recheck thyroid  function.  General Health Maintenance Routine  health maintenance discussed. Bone density scan recommended for osteoporosis assessment. - Ordered bone density scan at [facility] in [place].  Subjective:    Outpatient Medications Prior to Visit  Medication Sig Dispense Refill   amLODipine  (NORVASC ) 5 MG tablet Take 1 tablet (5 mg total) by mouth 2 (two) times daily. 180 tablet 1   atorvastatin  (LIPITOR) 20 MG tablet TAKE 1 TABLET DAILY 90 tablet 0   cloNIDine  (CATAPRES ) 0.1 MG tablet Take 1 tablet (0.1 mg total) by mouth at bedtime. For hot flashes/night sweats 90 tablet 1   empagliflozin  (JARDIANCE ) 10 MG TABS tablet Take 1 tablet (10 mg total) by mouth daily. 90 tablet 1   levothyroxine  (SYNTHROID ) 125 MCG tablet Take 1 tablet (125 mcg total) by mouth daily. 30 tablet 2   metoprolol  succinate (TOPROL -XL) 50 MG 24 hr tablet Take 2 tablets by mouth every evening. 180 tablet 0   olmesartan -hydrochlorothiazide  (BENICAR  HCT) 40-25 MG tablet Take 1 tablet by mouth in the morning. 90 tablet 1   tirzepatide  (MOUNJARO ) 5 MG/0.5ML Pen Inject 5 mg into the skin once a week. 6 mL 0   No facility-administered medications prior to visit.   Past Medical History:  Diagnosis Date   Abnormal uterine bleeding    Arthritis    Back muscle spasm 01/12/2011   Edema 12/14/2011   Fibroids    Hypertension    Hypothyroidism    Obesity (BMI 30-39.9)    Rash and nonspecific skin eruption 09/19/2011   Subacromial bursitis 11/25/2010   Better with steroid injection   Vertigo,  benign paroxysmal 04/25/2011   Past Surgical History:  Procedure Laterality Date   CYSTOSCOPY N/A 05/23/2018   Procedure: CYSTOSCOPY;  Surgeon: Rosalva Sawyer, MD;  Location: [facility];  Service: Gynecology;  Laterality: N/A;   NO PAST SURGERIES     ROBOTIC ASSISTED TOTAL HYSTERECTOMY WITH BILATERAL SALPINGO OOPHERECTOMY Bilateral 05/23/2018   Procedure: XI ROBOTIC ASSISTED TOTAL HYSTERECTOMY WITH BILATERAL SALPINGO OOPHORECTOMY;  Surgeon: Rosalva Sawyer, MD;  Location: [facility];  Service:  Gynecology;  Laterality: Bilateral;   SHOULDER OPEN ROTATOR CUFF REPAIR Right 03/09/2016   Procedure: OPEN RIGHT ROTATOR CUFF REPAIR WITH GRAFT AND ANCHORS;  Surgeon: Tanda Heading, MD;  Location: [facility];  Service: Orthopedics;  Laterality: Right;   No Known Allergies    Objective:    Physical Exam Vitals and nursing note reviewed.  Constitutional:      Appearance: Normal appearance.  Cardiovascular:     Rate and Rhythm: Normal rate and regular rhythm.  Pulmonary:     Effort: Pulmonary effort is normal.     Breath sounds: Normal breath sounds.  Musculoskeletal:        General: Normal range of motion.  Skin:    General: Skin is warm and dry.  Neurological:     Mental Status: She is alert.  Psychiatric:        Mood and Affect: Mood normal.        Behavior: Behavior normal.    BP 130/82 (BP Location: Left Arm, Patient Position: Sitting, Cuff Size: Normal)   Pulse 81   Temp (!) 97.2 F (36.2 C) (Temporal)   Ht 5' 3 (1.6 m)   Wt 183 lb 2 oz (83.1 kg)   LMP 02/15/2018 (Approximate)   SpO2 96%   BMI 32.44 kg/m  Wt Readings from Last 3 Encounters:  07/16/24 183 lb 2 oz (83.1 kg)  04/16/24 197 lb 9.6 oz (89.6 kg)  02/13/24 211 lb 12.8 oz (96.1 kg)      Lucius Krabbe, NP

## 2024-07-17 ENCOUNTER — Inpatient Hospital Stay
Admission: RE | Admit: 2024-07-17 | Discharge: 2024-07-17 | Payer: Medicare (Managed Care) | Attending: Family | Admitting: Family

## 2024-07-17 DIAGNOSIS — Z780 Asymptomatic menopausal state: Secondary | ICD-10-CM | POA: Diagnosis not present

## 2024-07-17 DIAGNOSIS — Z13820 Encounter for screening for osteoporosis: Secondary | ICD-10-CM | POA: Diagnosis not present

## 2024-07-18 ENCOUNTER — Ambulatory Visit: Payer: Self-pay | Admitting: Family

## 2024-07-18 MED ORDER — LEVOTHYROXINE SODIUM 125 MCG PO TABS: 125.0000 ug | ORAL_TABLET | Freq: Every day | ORAL | 2 refills | Status: DC

## 2024-07-18 NOTE — Telephone Encounter (Signed)
 responded in lab note.

## 2024-08-18 ENCOUNTER — Encounter: Payer: Self-pay | Admitting: Family

## 2024-08-19 DIAGNOSIS — E039 Hypothyroidism, unspecified: Secondary | ICD-10-CM

## 2024-08-19 MED ORDER — LEVOTHYROXINE SODIUM 125 MCG PO TABS: 125.0000 ug | ORAL_TABLET | Freq: Every day | ORAL | 1 refills | Status: AC

## 2024-08-23 ENCOUNTER — Ambulatory Visit (HOSPITAL_COMMUNITY): Payer: Medicare (Managed Care)

## 2024-08-27 ENCOUNTER — Encounter (INDEPENDENT_AMBULATORY_CARE_PROVIDER_SITE_OTHER): Payer: Self-pay | Admitting: Family

## 2024-08-27 DIAGNOSIS — K5903 Drug induced constipation: Secondary | ICD-10-CM

## 2024-08-29 DIAGNOSIS — K5903 Drug induced constipation: Secondary | ICD-10-CM

## 2024-08-29 MED ORDER — MAGNESIUM CITRATE PO SOLN: 1.0000 | Freq: Once | ORAL | 0 refills | Status: AC

## 2024-08-29 NOTE — Telephone Encounter (Signed)
Please see the MyChart message reply(ies) for my assessment and plan.  The patient gave consent for this Medical Advice Message and is aware that it may result in a bill to their insurance company as well as the possibility that this may result in a co-payment or deductible. They are an established patient, but are not seeking medical advice exclusively about a problem treated during an in person or video visit in the last 7 days. I did not recommend an in person or video visit within 7 days of my reply.  I spent a total of 12 minutes cumulative time within 7 days through MyChart messaging Denard Tuminello, NP  

## 2024-09-09 ENCOUNTER — Other Ambulatory Visit: Payer: Self-pay | Admitting: Family

## 2024-09-09 DIAGNOSIS — I10 Essential (primary) hypertension: Secondary | ICD-10-CM

## 2024-09-11 ENCOUNTER — Other Ambulatory Visit: Payer: Self-pay | Admitting: Family

## 2024-09-11 DIAGNOSIS — E782 Mixed hyperlipidemia: Secondary | ICD-10-CM
